# Patient Record
Sex: Male | Born: 1980
Health system: Southern US, Community
[De-identification: ages and names within clinical notes are randomized; demographics above are authoritative.]

## PROBLEM LIST (undated history)

## (undated) DIAGNOSIS — E7800 Pure hypercholesterolemia, unspecified: Secondary | ICD-10-CM

## (undated) HISTORY — PX: NO PAST SURGERIES: SHX2092

## (undated) HISTORY — DX: Pure hypercholesterolemia, unspecified: E78.00

---

## 2017-08-22 DIAGNOSIS — K649 Unspecified hemorrhoids: Secondary | ICD-10-CM | POA: Diagnosis not present

## 2017-09-11 DIAGNOSIS — K649 Unspecified hemorrhoids: Secondary | ICD-10-CM | POA: Diagnosis not present

## 2017-09-11 DIAGNOSIS — Z0000 Encounter for general adult medical examination without abnormal findings: Secondary | ICD-10-CM | POA: Diagnosis not present

## 2017-09-25 DIAGNOSIS — M25511 Pain in right shoulder: Secondary | ICD-10-CM | POA: Diagnosis not present

## 2017-09-25 DIAGNOSIS — M79641 Pain in right hand: Secondary | ICD-10-CM | POA: Diagnosis not present

## 2017-09-25 DIAGNOSIS — Z0000 Encounter for general adult medical examination without abnormal findings: Secondary | ICD-10-CM | POA: Diagnosis not present

## 2017-10-09 ENCOUNTER — Encounter: Payer: Self-pay | Admitting: Internal Medicine

## 2018-01-14 ENCOUNTER — Other Ambulatory Visit: Payer: Self-pay | Admitting: *Deleted

## 2018-01-14 ENCOUNTER — Encounter: Payer: Self-pay | Admitting: *Deleted

## 2018-01-14 ENCOUNTER — Encounter: Payer: Self-pay | Admitting: Nurse Practitioner

## 2018-01-14 ENCOUNTER — Ambulatory Visit: Payer: 59 | Admitting: Nurse Practitioner

## 2018-01-14 DIAGNOSIS — K625 Hemorrhage of anus and rectum: Secondary | ICD-10-CM | POA: Diagnosis not present

## 2018-01-14 DIAGNOSIS — K649 Unspecified hemorrhoids: Secondary | ICD-10-CM | POA: Diagnosis not present

## 2018-01-14 MED ORDER — PEG 3350-KCL-NA BICARB-NACL 420 G PO SOLR: 4000.0000 mL | Freq: Once | ORAL | 0 refills | Status: AC

## 2018-01-14 NOTE — Progress Notes (Signed)
Primary Care Physician:  Celene Squibb, MD Primary Gastroenterologist:  Dr. Gala Romney  Chief Complaint  Patient presents with  . Hemorrhoids    some blood when wipes    HPI:   Jeremy Combs is a 37 y.o. male who presents on referral from primary care for evaluation of hemorrhoids.  Reviewed information provided with the referral including office visit dated 09/25/2017 which was a routine physical.  At that time it was noted he has hemorrhoids and was using over-the-counter baby wipes and Tucks pads.  Has daily bowel movements and denies straining.  He was experiencing a flare at that time.  He was referred to GI for further evaluation..  No history of colonoscopy in our system.  Today he states he's doing ok overall. Started having hemorrhoids about 15 years ago, drives a truck for a living. Denies constipation or straining. Sits on the toilet for variable about of time depending on what he's doing on his phone. Is having some more fecal urgency. If he does more than once in a day it is painful with wiping. Uses OTC wet wipes. Still has toilet tissue hematochezia with every bowel movement. Has never had a colonoscopy. His brother had 3 polyps at age 7. Denies melena, fever, chills, unintentional weight loss. Occasional/rare dizziness, typically if he sitting down and eating; brief and self-resolves. Denies chest pain, dyspnea, lightheadedness, syncope, near syncope. Denies any other upper or lower GI symptoms.  Past Medical History:  Diagnosis Date  . Hypercholesterolemia     Past Surgical History:  Procedure Laterality Date  . NO PAST SURGERIES      Current Outpatient Medications  Medication Sig Dispense Refill  . ibuprofen (ADVIL,MOTRIN) 200 MG tablet Take 600 mg by mouth every 6 (six) hours as needed.     No current facility-administered medications for this visit.     Allergies as of 01/14/2018  . (No Known Allergies)    Family History  Problem Relation Age of Onset  .  Colon polyps Father 9  . Colon polyps Brother 41       Is going to need surgery to have them removed  . Colon cancer Neg Hx     Social History   Socioeconomic History  . Marital status: Single    Spouse name: Not on file  . Number of children: Not on file  . Years of education: Not on file  . Highest education level: Not on file  Occupational History  . Not on file  Social Needs  . Financial resource strain: Not on file  . Food insecurity:    Worry: Not on file    Inability: Not on file  . Transportation needs:    Medical: Not on file    Non-medical: Not on file  Tobacco Use  . Smoking status: Never Smoker  . Smokeless tobacco: Never Used  Substance and Sexual Activity  . Alcohol use: Yes    Comment: beer-less than 6 pack/week  . Drug use: Never  . Sexual activity: Not on file  Lifestyle  . Physical activity:    Days per week: Not on file    Minutes per session: Not on file  . Stress: Not on file  Relationships  . Social connections:    Talks on phone: Not on file    Gets together: Not on file    Attends religious service: Not on file    Active member of club or organization: Not on file    Attends  meetings of clubs or organizations: Not on file    Relationship status: Not on file  . Intimate partner violence:    Fear of current or ex partner: Not on file    Emotionally abused: Not on file    Physically abused: Not on file    Forced sexual activity: Not on file  Other Topics Concern  . Not on file  Social History Narrative  . Not on file    Review of Systems: General: Negative for anorexia, weight loss, fever, chills, fatigue, weakness. ENT: Negative for hoarseness, difficulty swallowing. CV: Negative for chest pain, angina, palpitations, peripheral edema.  Respiratory: Negative for dyspnea at rest, cough, sputum, wheezing.  GI: See history of present illness. MS: Negative for joint pain, low back pain.  Derm: Negative for rash or itching.  Endo:  Negative for unusual weight change.  Heme: Negative for bruising or bleeding. Allergy: Negative for rash or hives.    Physical Exam: BP (!) 143/76   Pulse 79   Temp (!) 97.3 F (36.3 C) (Oral)   Ht 6' (1.829 m)   Wt 227 lb 9.6 oz (103.2 kg)   BMI 30.87 kg/m  General:   Alert and oriented. Pleasant and cooperative. Well-nourished and well-developed.  Eyes:  Without icterus, sclera clear and conjunctiva pink.  Ears:  Normal auditory acuity. Cardiovascular:  S1, S2 present without murmurs appreciated. Extremities without clubbing or edema. Respiratory:  Clear to auscultation bilaterally. No wheezes, rales, or rhonchi. No distress.  Gastrointestinal:  +BS, soft, non-tender and non-distended. No HSM noted. No guarding or rebound. No masses appreciated.  Rectal:  Deferred  Musculoskalatal:  Symmetrical without gross deformities. Neurologic:  Alert and oriented x4;  grossly normal neurologically. Psych:  Alert and cooperative. Normal mood and affect. Heme/Lymph/Immune: No excessive bruising noted.    01/14/2018 4:17 PM   Disclaimer: This note was dictated with voice recognition software. Similar sounding words can inadvertently be transcribed and may not be corrected upon review.

## 2018-01-14 NOTE — Patient Instructions (Signed)
1. We will schedule your colonoscopy for you. 2. We will request your lab work from your primary care doctor. 3. Return for follow-up in 3 months.  We can discuss the possibility of hemorrhoid banding at that time. 4. Call us if you have any questions or concerns.  At [facility] we value your feedback. You may receive a survey about your visit today. Please share your experience as we strive to create trusting relationships with our patients to provide genuine, compassionate, quality care.  We appreciate your understanding and patience as we review any laboratory studies, imaging, and other diagnostic tests that are ordered as we care for you. Our office policy is 5 business days for review of these results, and any emergent or urgent results are addressed in a timely manner for your best interest. If you do not hear from our office in 1 week, please contact us.   We also encourage the use of MyChart, which contains your medical information for your review as well. If you are not enrolled in this feature, an access code is on this after visit summary for your convenience. Thank you for allowing [place] to be involved in your care.   It was great to see you today!  I hope you have a Merry Christmas!!

## 2018-01-14 NOTE — H&P (View-Only) (Signed)
Primary Care Physician:  Celene Squibb, MD Primary Gastroenterologist:  Dr. Gala Romney  Chief Complaint  Patient presents with  . Hemorrhoids    some blood when wipes    HPI:   Jeremy Combs is a 37 y.o. male who presents on referral from primary care for evaluation of hemorrhoids.  Reviewed information provided with the referral including office visit dated 09/25/2017 which was a routine physical.  At that time it was noted he has hemorrhoids and was using over-the-counter baby wipes and Tucks pads.  Has daily bowel movements and denies straining.  He was experiencing a flare at that time.  He was referred to GI for further evaluation..  No history of colonoscopy in our system.  Today he states he's doing ok overall. Started having hemorrhoids about 15 years ago, drives a truck for a living. Denies constipation or straining. Sits on the toilet for variable about of time depending on what he's doing on his phone. Is having some more fecal urgency. If he does more than once in a day it is painful with wiping. Uses OTC wet wipes. Still has toilet tissue hematochezia with every bowel movement. Has never had a colonoscopy. His brother had 3 polyps at age 37. Denies melena, fever, chills, unintentional weight loss. Occasional/rare dizziness, typically if he sitting down and eating; brief and self-resolves. Denies chest pain, dyspnea, lightheadedness, syncope, near syncope. Denies any other upper or lower GI symptoms.  Past Medical History:  Diagnosis Date  . Hypercholesterolemia     Past Surgical History:  Procedure Laterality Date  . NO PAST SURGERIES      Current Outpatient Medications  Medication Sig Dispense Refill  . ibuprofen (ADVIL,MOTRIN) 200 MG tablet Take 600 mg by mouth every 6 (six) hours as needed.     No current facility-administered medications for this visit.     Allergies as of 01/14/2018  . (No Known Allergies)    Family History  Problem Relation Age of Onset  .  Colon polyps Father 56  . Colon polyps Brother 20       Is going to need surgery to have them removed  . Colon cancer Neg Hx     Social History   Socioeconomic History  . Marital status: Single    Spouse name: Not on file  . Number of children: Not on file  . Years of education: Not on file  . Highest education level: Not on file  Occupational History  . Not on file  Social Needs  . Financial resource strain: Not on file  . Food insecurity:    Worry: Not on file    Inability: Not on file  . Transportation needs:    Medical: Not on file    Non-medical: Not on file  Tobacco Use  . Smoking status: Never Smoker  . Smokeless tobacco: Never Used  Substance and Sexual Activity  . Alcohol use: Yes    Comment: beer-less than 6 pack/week  . Drug use: Never  . Sexual activity: Not on file  Lifestyle  . Physical activity:    Days per week: Not on file    Minutes per session: Not on file  . Stress: Not on file  Relationships  . Social connections:    Talks on phone: Not on file    Gets together: Not on file    Attends religious service: Not on file    Active member of club or organization: Not on file    Attends  meetings of clubs or organizations: Not on file    Relationship status: Not on file  . Intimate partner violence:    Fear of current or ex partner: Not on file    Emotionally abused: Not on file    Physically abused: Not on file    Forced sexual activity: Not on file  Other Topics Concern  . Not on file  Social History Narrative  . Not on file    Review of Systems: General: Negative for anorexia, weight loss, fever, chills, fatigue, weakness. ENT: Negative for hoarseness, difficulty swallowing. CV: Negative for chest pain, angina, palpitations, peripheral edema.  Respiratory: Negative for dyspnea at rest, cough, sputum, wheezing.  GI: See history of present illness. MS: Negative for joint pain, low back pain.  Derm: Negative for rash or itching.  Endo:  Negative for unusual weight change.  Heme: Negative for bruising or bleeding. Allergy: Negative for rash or hives.    Physical Exam: BP (!) 143/76   Pulse 79   Temp (!) 97.3 F (36.3 C) (Oral)   Ht 6' (1.829 m)   Wt 227 lb 9.6 oz (103.2 kg)   BMI 30.87 kg/m  General:   Alert and oriented. Pleasant and cooperative. Well-nourished and well-developed.  Eyes:  Without icterus, sclera clear and conjunctiva pink.  Ears:  Normal auditory acuity. Cardiovascular:  S1, S2 present without murmurs appreciated. Extremities without clubbing or edema. Respiratory:  Clear to auscultation bilaterally. No wheezes, rales, or rhonchi. No distress.  Gastrointestinal:  +BS, soft, non-tender and non-distended. No HSM noted. No guarding or rebound. No masses appreciated.  Rectal:  Deferred  Musculoskalatal:  Symmetrical without gross deformities. Neurologic:  Alert and oriented x4;  grossly normal neurologically. Psych:  Alert and cooperative. Normal mood and affect. Heme/Lymph/Immune: No excessive bruising noted.    01/14/2018 4:17 PM   Disclaimer: This note was dictated with voice recognition software. Similar sounding words can inadvertently be transcribed and may not be corrected upon review.

## 2018-01-15 ENCOUNTER — Encounter: Payer: Self-pay | Admitting: Internal Medicine

## 2018-01-15 ENCOUNTER — Telehealth: Payer: Self-pay | Admitting: *Deleted

## 2018-01-15 NOTE — Telephone Encounter (Signed)
Pre-op scheduled for 12/17 at 2:15pm. Called patient and he voiced understanding. Letter mailed.

## 2018-01-15 NOTE — Telephone Encounter (Signed)
PA approved. Auth# [id number] date 01/29/18

## 2018-01-15 NOTE — Telephone Encounter (Signed)
PA submitted via [facility] website. PA has been pended. Tracking # [id number]

## 2018-01-16 NOTE — Assessment & Plan Note (Signed)
Rectal bleeding without constipation or straining in the setting of known hemorrhoids.  His brother did have 3 polyps age 36.  He states that they are planning surgery for his brothers polyps, query large flat polyp although details are unknown.  CBC has been done and we have requested this.  We will plan for colonoscopy as per above.  Likely hemorrhoid in nature, although cannot rule out more insidious pathology.  Return for follow-up in 3 months.

## 2018-01-16 NOTE — Assessment & Plan Note (Signed)
The patient admits chronic hemorrhoids for about 15 years.  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  No constipation or straining.  Possible prolonged sitting during toilet time as he tends to use his phone while using the bathroom.  Has rectal pain and scant toilet tissue hematochezia.  Uses wet wipes to help, Tucks pads when at home.  His brother had polyps at age 36.  At this point his primary care has recently done a CBC and we will request these records.  We will plan for colonoscopy at this time given rectal bleeding.  We can assess his candidacy for possible hemorrhoid banding to be done in office if appropriate.  Follow-up in 3 months.  Proceed with TCS on propofol/MAC with Dr. Gala Romney in near future: the risks, benefits, and alternatives have been discussed with the patient in detail. The patient states understanding and desires to proceed.  The patient is not on any anticoagulants, anxiolytics, chronic pain medications, or antidepressants.  Drinks weekly, typically less than a sixpack a week.  He is of young age.  Given these we will plan for the procedure on propofol/MAC to promote adequate sedation.

## 2018-01-19 NOTE — Progress Notes (Signed)
CC'D TO PCP °

## 2018-01-22 NOTE — Patient Instructions (Signed)
Jeremy Combs  01/22/2018     @PREFPERIOPPHARMACY @   Your procedure is scheduled on  01/29/2018 .  Report to [facility] at  1215   P.M.  Call this number if you have problems the morning of surgery:  [phone]   Remember:  Follow the diet and prep instructions given to you by Dr Roseanne Kaufman office.                       Take these medicines the morning of surgery with A SIP OF WATER  None    Do not wear jewelry, make-up or nail polish.  Do not wear lotions, powders, or perfumes, or deodorant.  Do not shave 48 hours prior to surgery.  Men may shave face and neck.  Do not bring valuables to the hospital.  [facility] is not responsible for any belongings or valuables.  Contacts, dentures or bridgework may not be worn into surgery.  Leave your suitcase in the car.  After surgery it may be brought to your room.  For patients admitted to the hospital, discharge time will be determined by your treatment team.  Patients discharged the day of surgery will not be allowed to drive home.   Name and phone number of your driver:    Family    Special instructions:  None  Please read over the following fact sheets that you were given. Anesthesia Post-op Instructions and Care and Recovery After Surgery       Colonoscopy, Adult A colonoscopy is an exam to look at the large intestine. It is done to check for problems, such as:  Lumps (tumors).  Growths (polyps).  Swelling (inflammation).  Bleeding.  What happens before the procedure? Eating and drinking Follow instructions from your doctor about eating and drinking. These instructions may include:  A few days before the procedure - follow a low-fiber diet. ? Avoid nuts. ? Avoid seeds. ? Avoid dried fruit. ? Avoid raw fruits. ? Avoid vegetables.  1-3 days before the procedure - follow a clear liquid diet. Avoid liquids that have red or purple dye. Drink only clear liquids, such as: ? Clear broth or  bouillon. ? Black coffee or tea. ? Clear juice. ? Clear soft drinks or sports drinks. ? Gelatin dessert. ? Popsicles.  On the day of the procedure - do not eat or drink anything during the 2 hours before the procedure.  Bowel prep If you were prescribed an oral bowel prep:  Take it as told by your doctor. Starting the day before your procedure, you will need to drink a lot of liquid. The liquid will cause you to poop (have bowel movements) until your poop is almost clear or light green.  If your skin or butt gets irritated from diarrhea, you may: ? Wipe the area with wipes that have medicine in them, such as adult wet wipes with aloe and vitamin E. ? Put something on your skin that soothes the area, such as petroleum jelly.  If you throw up (vomit) while drinking the bowel prep, take a break for up to 60 minutes. Then begin the bowel prep again. If you keep throwing up and you cannot take the bowel prep without throwing up, call your doctor.  General instructions  Ask your doctor about changing or stopping your normal medicines. This is important if you take diabetes medicines or blood thinners.  Plan to have someone take you  home from the hospital or clinic. What happens during the procedure?  An IV tube may be put into one of your veins.  You will be given medicine to help you relax (sedative).  To reduce your risk of infection: ? Your doctors will wash their hands. ? Your anal area will be washed with soap.  You will be asked to lie on your side with your knees bent.  Your doctor will get a long, thin, flexible tube ready. The tube will have a camera and a light on the end.  The tube will be put into your anus.  The tube will be gently put into your large intestine.  Air will be delivered into your large intestine to keep it open. You may feel some pressure or cramping.  The camera will be used to take photos.  A small tissue sample may be removed from your body to  be looked at under a microscope (biopsy). If any possible problems are found, the tissue will be sent to a lab for testing.  If small growths are found, your doctor may remove them and have them checked for cancer.  The tube that was put into your anus will be slowly removed. The procedure may vary among doctors and hospitals. What happens after the procedure?  Your doctor will check on you often until the medicines you were given have worn off.  Do not drive for 24 hours after the procedure.  You may have a small amount of blood in your poop.  You may pass gas.  You may have mild cramps or bloating in your belly (abdomen).  It is up to you to get the results of your procedure. Ask your doctor, or the department performing the procedure, when your results will be ready. This information is not intended to replace advice given to you by your health care provider. Make sure you discuss any questions you have with your health care provider. Document Released: 03/02/2010 Document Revised: 11/29/2015 Document Reviewed: 04/11/2015 Elsevier Interactive Patient Education  2017 [employer]  Colonoscopy, Adult, Care After This sheet gives you information about how to care for yourself after your procedure. Your health care provider may also give you more specific instructions. If you have problems or questions, contact your health care provider. What can I expect after the procedure? After the procedure, it is common to have:  A small amount of blood in your stool for 24 hours after the procedure.  Some gas.  Mild abdominal cramping or bloating.  Follow these instructions at home: General instructions   For the first 24 hours after the procedure: ? Do not drive or use machinery. ? Do not sign important documents. ? Do not drink alcohol. ? Do your regular daily activities at a slower pace than normal. ? Eat soft, easy-to-digest foods. ? Rest often.  Take over-the-counter or  prescription medicines only as told by your health care provider.  It is up to you to get the results of your procedure. Ask your health care provider, or the department performing the procedure, when your results will be ready. Relieving cramping and bloating  Try walking around when you have cramps or feel bloated.  Apply heat to your abdomen as told by your health care provider. Use a heat source that your health care provider recommends, such as a moist heat pack or a heating pad. ? Place a towel between your skin and the heat source. ? Leave the heat on for 20-30 minutes. ?  Remove the heat if your skin turns bright red. This is especially important if you are unable to feel pain, heat, or cold. You may have a greater risk of getting burned. Eating and drinking  Drink enough fluid to keep your urine clear or pale yellow.  Resume your normal diet as instructed by your health care provider. Avoid heavy or fried foods that are hard to digest.  Avoid drinking alcohol for as long as instructed by your health care provider. Contact a health care provider if:  You have blood in your stool 2-3 days after the procedure. Get help right away if:  You have more than a small spotting of blood in your stool.  You pass large blood clots in your stool.  Your abdomen is swollen.  You have nausea or vomiting.  You have a fever.  You have increasing abdominal pain that is not relieved with medicine. This information is not intended to replace advice given to you by your health care provider. Make sure you discuss any questions you have with your health care provider. Document Released: 09/12/2003 Document Revised: 10/23/2015 Document Reviewed: 04/11/2015 Elsevier Interactive Patient Education  2018 [place] Anesthesia is a term that refers to techniques, procedures, and medicines that help a person stay safe and comfortable during a medical procedure.  Monitored anesthesia care, or sedation, is one type of anesthesia. Your anesthesia specialist may recommend sedation if you will be having a procedure that does not require you to be unconscious, such as:  Cataract surgery.  A dental procedure.  A biopsy.  A colonoscopy.  During the procedure, you may receive a medicine to help you relax (sedative). There are three levels of sedation:  Mild sedation. At this level, you may feel awake and relaxed. You will be able to follow directions.  Moderate sedation. At this level, you will be sleepy. You may not remember the procedure.  Deep sedation. At this level, you will be asleep. You will not remember the procedure.  The more medicine you are given, the deeper your level of sedation will be. Depending on how you respond to the procedure, the anesthesia specialist may change your level of sedation or the type of anesthesia to fit your needs. An anesthesia specialist will monitor you closely during the procedure. Let your health care provider know about:  Any allergies you have.  All medicines you are taking, including vitamins, herbs, eye drops, creams, and over-the-counter medicines.  Any use of steroids (by mouth or as a cream).  Any problems you or family members have had with sedatives and anesthetic medicines.  Any blood disorders you have.  Any surgeries you have had.  Any medical conditions you have, such as sleep apnea.  Whether you are pregnant or may be pregnant.  Any use of cigarettes, alcohol, or street drugs. What are the risks? Generally, this is a safe procedure. However, problems may occur, including:  Getting too much medicine (oversedation).  Nausea.  Allergic reaction to medicines.  Trouble breathing. If this happens, a breathing tube may be used to help with breathing. It will be removed when you are awake and breathing on your own.  Heart trouble.  Lung trouble.  Before the procedure Staying  hydrated Follow instructions from your health care provider about hydration, which may include:  Up to 2 hours before the procedure - you may continue to drink clear liquids, such as water, clear fruit juice, black coffee, and plain tea.  Eating and drinking restrictions Follow instructions from your health care provider about eating and drinking, which may include:  8 hours before the procedure - stop eating heavy meals or foods such as meat, fried foods, or fatty foods.  6 hours before the procedure - stop eating light meals or foods, such as toast or cereal.  6 hours before the procedure - stop drinking milk or drinks that contain milk.  2 hours before the procedure - stop drinking clear liquids.  Medicines Ask your health care provider about:  Changing or stopping your regular medicines. This is especially important if you are taking diabetes medicines or blood thinners.  Taking medicines such as aspirin and ibuprofen. These medicines can thin your blood. Do not take these medicines before your procedure if your health care provider instructs you not to.  Tests and exams  You will have a physical exam.  You may have blood tests done to show: ? How well your kidneys and liver are working. ? How well your blood can clot.  General instructions  Plan to have someone take you home from the hospital or clinic.  If you will be going home right after the procedure, plan to have someone with you for 24 hours.  What happens during the procedure?  Your blood pressure, heart rate, breathing, level of pain and overall condition will be monitored.  An IV tube will be inserted into one of your veins.  Your anesthesia specialist will give you medicines as needed to keep you comfortable during the procedure. This may mean changing the level of sedation.  The procedure will be performed. After the procedure  Your blood pressure, heart rate, breathing rate, and blood oxygen level  will be monitored until the medicines you were given have worn off.  Do not drive for 24 hours if you received a sedative.  You may: ? Feel sleepy, clumsy, or nauseous. ? Feel forgetful about what happened after the procedure. ? Have a sore throat if you had a breathing tube during the procedure. ? Vomit. This information is not intended to replace advice given to you by your health care provider. Make sure you discuss any questions you have with your health care provider. Document Released: 10/24/2004 Document Revised: 07/07/2015 Document Reviewed: 05/21/2015 Elsevier Interactive Patient Education  2018 [place], Care After These instructions provide you with information about caring for yourself after your procedure. Your health care provider may also give you more specific instructions. Your treatment has been planned according to current medical practices, but problems sometimes occur. Call your health care provider if you have any problems or questions after your procedure. What can I expect after the procedure? After your procedure, it is common to:  Feel sleepy for several hours.  Feel clumsy and have poor balance for several hours.  Feel forgetful about what happened after the procedure.  Have poor judgment for several hours.  Feel nauseous or vomit.  Have a sore throat if you had a breathing tube during the procedure.  Follow these instructions at home: For at least 24 hours after the procedure:   Do not: ? Participate in activities in which you could fall or become injured. ? Drive. ? Use heavy machinery. ? Drink alcohol. ? Take sleeping pills or medicines that cause drowsiness. ? Make important decisions or sign legal documents. ? Take care of children on your own.  Rest. Eating and drinking  Follow the diet that is recommended by your  health care provider.  If you vomit, drink water, juice, or soup when you can drink without  vomiting.  Make sure you have little or no nausea before eating solid foods. General instructions  Have a responsible adult stay with you until you are awake and alert.  Take over-the-counter and prescription medicines only as told by your health care provider.  If you smoke, do not smoke without supervision.  Keep all follow-up visits as told by your health care provider. This is important. Contact a health care provider if:  You keep feeling nauseous or you keep vomiting.  You feel light-headed.  You develop a rash.  You have a fever. Get help right away if:  You have trouble breathing. This information is not intended to replace advice given to you by your health care provider. Make sure you discuss any questions you have with your health care provider. Document Released: 05/21/2015 Document Revised: 09/20/2015 Document Reviewed: 05/21/2015 Elsevier Interactive Patient Education  [employer].

## 2018-01-23 ENCOUNTER — Other Ambulatory Visit (HOSPITAL_COMMUNITY): Payer: 59

## 2018-01-27 ENCOUNTER — Encounter (HOSPITAL_COMMUNITY): Payer: Self-pay

## 2018-01-27 ENCOUNTER — Other Ambulatory Visit: Payer: Self-pay

## 2018-01-27 ENCOUNTER — Encounter (HOSPITAL_COMMUNITY)
Admission: RE | Admit: 2018-01-27 | Discharge: 2018-01-27 | Disposition: A | Discharge status: Home / Self Care | Payer: 59 | Source: Ambulatory Visit | Attending: Internal Medicine | Admitting: Internal Medicine

## 2018-01-27 DIAGNOSIS — K640 First degree hemorrhoids: Secondary | ICD-10-CM | POA: Diagnosis not present

## 2018-01-27 DIAGNOSIS — E7800 Pure hypercholesterolemia, unspecified: Secondary | ICD-10-CM | POA: Diagnosis not present

## 2018-01-27 DIAGNOSIS — Z01812 Encounter for preprocedural laboratory examination: Secondary | ICD-10-CM

## 2018-01-27 DIAGNOSIS — Z791 Long term (current) use of non-steroidal anti-inflammatories (NSAID): Secondary | ICD-10-CM | POA: Diagnosis not present

## 2018-01-27 DIAGNOSIS — K921 Melena: Secondary | ICD-10-CM | POA: Diagnosis present

## 2018-01-27 DIAGNOSIS — K635 Polyp of colon: Secondary | ICD-10-CM | POA: Diagnosis not present

## 2018-01-27 LAB — CBC
HCT: 46.2 % (ref 39.0–52.0)
Hemoglobin: 15.5 g/dL (ref 13.0–17.0)
MCH: 29.4 pg (ref 26.0–34.0)
MCHC: 33.5 g/dL (ref 30.0–36.0)
MCV: 87.5 fL (ref 80.0–100.0)
Platelets: 283 10*3/uL (ref 150–400)
RBC: 5.28 MIL/uL (ref 4.22–5.81)
RDW: 12.5 % (ref 11.5–15.5)
WBC: 9 10*3/uL (ref 4.0–10.5)
nRBC: 0 % (ref 0.0–0.2)

## 2018-01-29 ENCOUNTER — Ambulatory Visit (HOSPITAL_COMMUNITY): Payer: 59 | Admitting: Registered Nurse

## 2018-01-29 ENCOUNTER — Encounter (HOSPITAL_COMMUNITY)
Admission: RE | Disposition: A | Discharge status: Home / Self Care | Payer: Self-pay | Source: Ambulatory Visit | Attending: Internal Medicine

## 2018-01-29 ENCOUNTER — Encounter (HOSPITAL_COMMUNITY): Payer: Self-pay

## 2018-01-29 ENCOUNTER — Ambulatory Visit (HOSPITAL_COMMUNITY)
Admission: RE | Admit: 2018-01-29 | Discharge: 2018-01-29 | Disposition: A | Discharge status: Home / Self Care | Payer: 59 | Source: Ambulatory Visit | Attending: Internal Medicine | Admitting: Internal Medicine

## 2018-01-29 DIAGNOSIS — K640 First degree hemorrhoids: Secondary | ICD-10-CM | POA: Insufficient documentation

## 2018-01-29 DIAGNOSIS — K649 Unspecified hemorrhoids: Secondary | ICD-10-CM

## 2018-01-29 DIAGNOSIS — D125 Benign neoplasm of sigmoid colon: Secondary | ICD-10-CM

## 2018-01-29 DIAGNOSIS — K635 Polyp of colon: Secondary | ICD-10-CM | POA: Insufficient documentation

## 2018-01-29 DIAGNOSIS — E7800 Pure hypercholesterolemia, unspecified: Secondary | ICD-10-CM | POA: Diagnosis not present

## 2018-01-29 DIAGNOSIS — K625 Hemorrhage of anus and rectum: Secondary | ICD-10-CM

## 2018-01-29 DIAGNOSIS — K921 Melena: Secondary | ICD-10-CM

## 2018-01-29 DIAGNOSIS — Z791 Long term (current) use of non-steroidal anti-inflammatories (NSAID): Secondary | ICD-10-CM | POA: Insufficient documentation

## 2018-01-29 HISTORY — PX: POLYPECTOMY: SHX5525

## 2018-01-29 HISTORY — PX: COLONOSCOPY WITH PROPOFOL: SHX5780

## 2018-01-29 SURGERY — COLONOSCOPY WITH PROPOFOL
Anesthesia: Monitor Anesthesia Care

## 2018-01-29 MED ORDER — CHLORHEXIDINE GLUCONATE CLOTH 2 % EX PADS: 6.0000 | MEDICATED_PAD | Freq: Once | CUTANEOUS | Status: DC

## 2018-01-29 MED ORDER — PROPOFOL 10 MG/ML IV BOLUS
INTRAVENOUS | Status: AC
  Filled 2018-01-29: qty 20

## 2018-01-29 MED ORDER — PROPOFOL 10 MG/ML IV BOLUS
INTRAVENOUS | Status: DC | PRN
  Administered 2018-01-29: 30 mg via INTRAVENOUS

## 2018-01-29 MED ORDER — PROPOFOL 500 MG/50ML IV EMUL
INTRAVENOUS | Status: DC | PRN
  Administered 2018-01-29: 200 ug/kg/min via INTRAVENOUS

## 2018-01-29 MED ORDER — LACTATED RINGERS IV SOLN
INTRAVENOUS | Status: DC
  Administered 2018-01-29: 13:00:00 via INTRAVENOUS

## 2018-01-29 MED ORDER — STERILE WATER FOR IRRIGATION IR SOLN
Status: DC | PRN
  Administered 2018-01-29: 1.5 mL

## 2018-01-29 NOTE — Transfer of Care (Signed)
Immediate Anesthesia Transfer of Care Note  Patient: Jeremy Combs  Procedure(s) Performed: COLONOSCOPY WITH PROPOFOL (N/A ) POLYPECTOMY  Patient Location: PACU  Anesthesia Type:MAC  Level of Consciousness: sedated  Airway & Oxygen Therapy: Patient Spontanous Breathing  Post-op Assessment: Report given to RN and Post -op Vital signs reviewed and stable  Post vital signs: Reviewed and stable  Last Vitals:  Vitals Value Taken Time  BP    Temp    Pulse    Resp    SpO2      Last Pain:  Vitals:   01/29/18 1406  TempSrc:   PainSc: 0-No pain         Complications: No apparent anesthesia complications

## 2018-01-29 NOTE — Op Note (Signed)
[facility] Patient Name: Jeremy Combs Procedure Date: 01/29/2018 2:03 PM MRN: [id number] Date of Birth: 1980/03/13 Attending MD: Norvel Richards , MD CSN: [id number] Age: 37 Admit Type: Outpatient Procedure:                Colonoscopy Indications:              Hematochezia Providers:                Norvel Richards, MD, Lurline Del, RN, Gerome Sam, RN, Nelma Rothman, Technician Referring MD:              Medicines:                Propofol per Anesthesia Complications:            No immediate complications. Estimated Blood Loss:     Estimated blood loss was minimal. Procedure:                Pre-Anesthesia Assessment:                           - Prior to the procedure, a History and Physical                            was performed, and patient medications and                            allergies were reviewed. The patient's tolerance of                            previous anesthesia was also reviewed. The risks                            and benefits of the procedure and the sedation                            options and risks were discussed with the patient.                            All questions were answered, and informed consent                            was obtained. Prior Anticoagulants: The patient has                            taken no previous anticoagulant or antiplatelet                            agents. ASA Grade Assessment: II - A patient with                            mild systemic disease. After reviewing the risks  and benefits, the patient was deemed in                            satisfactory condition to undergo the procedure.                           After obtaining informed consent, the colonoscope                            was passed under direct vision. Throughout the                            procedure, the patient's blood pressure, pulse, and                            oxygen  saturations were monitored continuously. The                            [id number]-[id number] ([id number]) scope was introduced through                            the and advanced to the the cecum, identified by                            appendiceal orifice and ileocecal valve. The                            colonoscopy was performed without difficulty. The                            patient tolerated the procedure well. The quality                            of the bowel preparation was adequate. Scope In: 2:10:56 PM Scope Out: 2:21:59 PM Scope Withdrawal Time: 0 hours 8 minutes 47 seconds  Total Procedure Duration: 0 hours 11 minutes 3 seconds  Findings:      The perianal and digital rectal examinations were normal.      A 5 mm polyp was found in the sigmoid colon. The polyp was sessile. The       polyp was removed with a cold snare. Resection and retrieval were       complete. Estimated blood loss was minimal.      The exam was otherwise without abnormality on direct and retroflexion       views.      Non-bleeding internal hemorrhoids were found during endoscopy. The       hemorrhoids were moderate, medium-sized and Grade I (internal       hemorrhoids that do not prolapse).      The exam was otherwise without abnormality. Impression:               - One 5 mm polyp in the sigmoid colon, removed with                            a cold snare. Resected and retrieved.                           -  The examination was otherwise normal on direct                            and retroflexion views.                           - Non-bleeding internal hemorrhoids.                           - The examination was otherwise normal. Moderate Sedation:      Moderate (conscious) sedation was personally administered by an       anesthesia professional. The following parameters were monitored: oxygen       saturation, heart rate, blood pressure, respiratory rate, EKG, adequacy       of pulmonary ventilation, and  response to care. Recommendation:           - Patient has a contact number available for                            emergencies. The signs and symptoms of potential                            delayed complications were discussed with the                            patient. Return to normal activities tomorrow.                            Written discharge instructions were provided to the                            patient.                           - Advance diet as tolerated.                           - Continue present medications.                           - Repeat colonoscopy at age 9 for screening                            purposes. Pamphlet on hemorrhoid banding provided                           - Return to GI office in 6 weeks. Procedure Code(s):        --- Professional ---                           [phone], Colonoscopy, flexible; with removal of                            tumor(s), polyp(s), or other lesion(s) by snare  technique Diagnosis Code(s):        --- Professional ---                           D12.5, Benign neoplasm of sigmoid colon                           K64.0, First degree hemorrhoids                           K92.1, Melena (includes Hematochezia) CPT copyright 2018 American Medical Association. All rights reserved. The codes documented in this report are preliminary and upon coder review may  be revised to meet current compliance requirements. [name]. [name], MD Norvel Richards, MD 01/29/2018 2:32:56 PM This report has been signed electronically. Number of Addenda: 0

## 2018-01-29 NOTE — Interval H&P Note (Signed)
History and Physical Interval Note:  01/29/2018 2:04 PM  Jeremy Combs  has presented today for surgery, with the diagnosis of rectal bleeding, hemorrhoids  The various methods of treatment have been discussed with the patient and family. After consideration of risks, benefits and other options for treatment, the patient has consented to  Procedure(s) with comments: COLONOSCOPY WITH PROPOFOL (N/A) - 1:45pm as a surgical intervention .  The patient's history has been reviewed, patient examined, no change in status, stable for surgery.  I have reviewed the patient's chart and labs.  Questions were answered to the patient's satisfaction.     Robert Rourk  No change.  Diagnostic colonoscopy per plan.  The risks, benefits, limitations, alternatives and imponderables have been reviewed with the patient. Questions have been answered. All parties are agreeable.

## 2018-01-29 NOTE — Anesthesia Postprocedure Evaluation (Signed)
Anesthesia Post Note  Patient: Jeremy Combs  Procedure(s) Performed: COLONOSCOPY WITH PROPOFOL (N/A ) POLYPECTOMY  Patient location during evaluation: PACU Anesthesia Type: MAC Level of consciousness: awake and alert Pain management: pain level controlled Vital Signs Assessment: post-procedure vital signs reviewed and stable Respiratory status: spontaneous breathing Cardiovascular status: blood pressure returned to baseline Postop Assessment: no apparent nausea or vomiting Anesthetic complications: no     Last Vitals:  Vitals:   01/29/18 1233 01/29/18 1426  BP: 131/73 (!) 102/51  Pulse:  78  Resp:  12  Temp:  37.1 C  SpO2:  97%    Last Pain:  Vitals:   01/29/18 1406  TempSrc:   PainSc: 0-No pain                 Collen Vincent

## 2018-01-29 NOTE — Anesthesia Procedure Notes (Signed)
Date/Time: 01/29/2018 2:07 PM Performed by: Talbot Grumbling, CRNA Oxygen Delivery Method: Simple face mask

## 2018-01-29 NOTE — Discharge Instructions (Signed)
Colonoscopy Discharge Instructions  Read the instructions outlined below and refer to this sheet in the next few weeks. These discharge instructions provide you with general information on caring for yourself after you leave the hospital. Your doctor may also give you specific instructions. While your treatment has been planned according to the most current medical practices available, unavoidable complications occasionally occur. If you have any problems or questions after discharge, call Dr. Gala Romney at [phone]. ACTIVITY  You may resume your regular activity, but move at a slower pace for the next 24 hours.   Take frequent rest periods for the next 24 hours.   Walking will help get rid of the air and reduce the bloated feeling in your belly (abdomen).   No driving for 24 hours (because of the medicine (anesthesia) used during the test).    Do not sign any important legal documents or operate any machinery for 24 hours (because of the anesthesia used during the test).  NUTRITION  Drink plenty of fluids.   You may resume your normal diet as instructed by your doctor.   Begin with a light meal and progress to your normal diet. Heavy or fried foods are harder to digest and may make you feel sick to your stomach (nauseated).   Avoid alcoholic beverages for 24 hours or as instructed.  MEDICATIONS  You may resume your normal medications unless your doctor tells you otherwise.  WHAT YOU CAN EXPECT TODAY  Some feelings of bloating in the abdomen.   Passage of more gas than usual.   Spotting of blood in your stool or on the toilet paper.  IF YOU HAD POLYPS REMOVED DURING THE COLONOSCOPY:  No aspirin products for 7 days or as instructed.   No alcohol for 7 days or as instructed.   Eat a soft diet for the next 24 hours.  FINDING OUT THE RESULTS OF YOUR TEST Not all test results are available during your visit. If your test results are not back during the visit, make an appointment  with your caregiver to find out the results. Do not assume everything is normal if you have not heard from your caregiver or the medical facility. It is important for you to follow up on all of your test results.  SEEK IMMEDIATE MEDICAL ATTENTION IF:  You have more than a spotting of blood in your stool.   Your belly is swollen (abdominal distention).   You are nauseated or vomiting.   You have a temperature over 101.   You have abdominal pain or discomfort that is severe or gets worse throughout the day.   Colon polyp and hemorrhoid information provided  Pamphlet on hemorrhoid banding provided  Further recommendations to follow pending review of pathology report  Office visit with AB in 4- 6 weeks for possible hemorrhoid banding     Colon Polyps  Polyps are tissue growths inside the body. Polyps can grow in many places, including the large intestine (colon). A polyp may be a round bump or a mushroom-shaped growth. You could have one polyp or several. Most colon polyps are noncancerous (benign). However, some colon polyps can become cancerous over time. Finding and removing the polyps early can help prevent this. What are the causes? The exact cause of colon polyps is not known. What increases the risk? You are more likely to develop this condition if you:  Have a family history of colon cancer or colon polyps.  Are older than 50 or older than 45 if  you are African American.  Have inflammatory bowel disease, such as ulcerative colitis or Crohn's disease.  Have certain hereditary conditions, such as: ? Familial adenomatous polyposis. ? Lynch syndrome. ? Turcot syndrome. ? Peutz-Jeghers syndrome.  Are overweight.  Smoke cigarettes.  Do not get enough exercise.  Drink too much alcohol.  Eat a diet that is high in fat and red meat and low in fiber.  Had childhood cancer that was treated with abdominal radiation. What are the signs or symptoms? Most polyps do not  cause symptoms. If you have symptoms, they may include:  Blood coming from your rectum when having a bowel movement.  Blood in your stool. The stool may look dark red or black.  Abdominal pain.  A change in bowel habits, such as constipation or diarrhea. How is this diagnosed? This condition is diagnosed with a colonoscopy. This is a procedure in which a lighted, flexible scope is inserted into the anus and then passed into the colon to examine the area. Polyps are sometimes found when a colonoscopy is done as part of routine cancer screening tests. How is this treated? Treatment for this condition involves removing any polyps that are found. Most polyps can be removed during a colonoscopy. Those polyps will then be tested for cancer. Additional treatment may be needed depending on the results of testing. Follow these instructions at home: Lifestyle  Maintain a healthy weight, or lose weight if recommended by your health care provider.  Exercise every day or as told by your health care provider.  Do not use any products that contain nicotine or tobacco, such as cigarettes and e-cigarettes. If you need help quitting, ask your health care provider.  If you drink alcohol, limit how much you have: ? 0-1 drink a day for women. ? 0-2 drinks a day for men.  Be aware of how much alcohol is in your drink. In the [place] one drink equals one 12 oz bottle of beer (355 mL), one 5 oz glass of wine (148 mL), or one 1 oz shot of hard liquor (44 mL). Eating and drinking   Eat foods that are high in fiber, such as fruits, vegetables, and whole grains.  Eat foods that are high in calcium and vitamin D, such as milk, cheese, yogurt, eggs, liver, fish, and broccoli.  Limit foods that are high in fat, such as fried foods and desserts.  Limit the amount of red meat and processed meat you eat, such as hot dogs, sausage, bacon, and lunch meats. General instructions  Keep all follow-up visits as told  by your health care provider. This is important. ? This includes having regularly scheduled colonoscopies. ? Talk to your health care provider about when you need a colonoscopy. Contact a health care provider if:  You have new or worsening bleeding during a bowel movement.  You have new or increased blood in your stool.  You have a change in bowel habits.  You lose weight for no known reason. Summary  Polyps are tissue growths inside the body. Polyps can grow in many places, including the colon.  Most colon polyps are noncancerous (benign), but some can become cancerous over time.  This condition is diagnosed with a colonoscopy.  Treatment for this condition involves removing any polyps that are found. Most polyps can be removed during a colonoscopy. This information is not intended to replace advice given to you by your health care provider. Make sure you discuss any questions you have with your health  care provider. Document Released: 10/25/2003 Document Revised: 05/15/2017 Document Reviewed: 05/15/2017 Elsevier Interactive Patient Education  2019 [employer]     Hemorrhoids Hemorrhoids are swollen veins in and around the rectum or anus. There are two types of hemorrhoids:  Internal hemorrhoids. These occur in the veins that are just inside the rectum. They may poke through to the outside and become irritated and painful.  External hemorrhoids. These occur in the veins that are outside the anus and can be felt as a painful swelling or hard lump near the anus. Most hemorrhoids do not cause serious problems, and they can be managed with home treatments such as diet and lifestyle changes. If home treatments do not help the symptoms, procedures can be done to shrink or remove the hemorrhoids. What are the causes? This condition is caused by increased pressure in the anal area. This pressure may result from various things, including:  Constipation.  Straining to have a bowel  movement.  Diarrhea.  Pregnancy.  Obesity.  Sitting for long periods of time.  Heavy lifting or other activity that causes you to strain.  Anal sex.  Riding a bike for a long period of time. What are the signs or symptoms? Symptoms of this condition include:  Pain.  Anal itching or irritation.  Rectal bleeding.  Leakage of stool (feces).  Anal swelling.  One or more lumps around the anus. How is this diagnosed? This condition can often be diagnosed through a visual exam. Other exams or tests may also be done, such as:  An exam that involves feeling the rectal area with a gloved hand (digital rectal exam).  An exam of the anal canal that is done using a small tube (anoscope).  A blood test, if you have lost a significant amount of blood.  A test to look inside the colon using a flexible tube with a camera on the end (sigmoidoscopy or colonoscopy). How is this treated? This condition can usually be treated at home. However, various procedures may be done if dietary changes, lifestyle changes, and other home treatments do not help your symptoms. These procedures can help make the hemorrhoids smaller or remove them completely. Some of these procedures involve surgery, and others do not. Common procedures include:  Rubber band ligation. Rubber bands are placed at the base of the hemorrhoids to cut off their blood supply.  Sclerotherapy. Medicine is injected into the hemorrhoids to shrink them.  Infrared coagulation. A type of light energy is used to get rid of the hemorrhoids.  Hemorrhoidectomy surgery. The hemorrhoids are surgically removed, and the veins that supply them are tied off.  Stapled hemorrhoidopexy surgery. The surgeon staples the base of the hemorrhoid to the rectal wall. Follow these instructions at home: Eating and drinking   Eat foods that have a lot of fiber in them, such as whole grains, beans, nuts, fruits, and vegetables.  Ask your health care  provider about taking products that have added fiber (fiber supplements).  Reduce the amount of fat in your diet. You can do this by eating low-fat dairy products, eating less red meat, and avoiding processed foods.  Drink enough fluid to keep your urine pale yellow. Managing pain and swelling   Take warm sitz baths for 20 minutes, 3-4 times a day to ease pain and discomfort. You may do this in a bathtub or using a portable sitz bath that fits over the toilet.  If directed, apply ice to the affected area. Using ice packs between  sitz baths may be helpful. ? Put ice in a plastic bag. ? Place a towel between your skin and the bag. ? Leave the ice on for 20 minutes, 2-3 times a day. General instructions  Take over-the-counter and prescription medicines only as told by your health care provider.  Use medicated creams or suppositories as told.  Get regular exercise. Ask your health care provider how much and what kind of exercise is best for you. In general, you should do moderate exercise for at least 30 minutes on most days of the week (150 minutes each week). This can include activities such as walking, biking, or yoga.  Go to the bathroom when you have the urge to have a bowel movement. Do not wait.  Avoid straining to have bowel movements.  Keep the anal area dry and clean. Use wet toilet paper or moist towelettes after a bowel movement.  Do not sit on the toilet for long periods of time. This increases blood pooling and pain.  Keep all follow-up visits as told by your health care provider. This is important. Contact a health care provider if you have:  Increasing pain and swelling that are not controlled by treatment or medicine.  Difficulty having a bowel movement, or you are unable to have a bowel movement.  Pain or inflammation outside the area of the hemorrhoids. Get help right away if you have:  Uncontrolled bleeding from your rectum. Summary  Hemorrhoids are swollen  veins in and around the rectum or anus.  Most hemorrhoids can be managed with home treatments such as diet and lifestyle changes.  Taking warm sitz baths can help ease pain and discomfort.  In severe cases, procedures or surgery can be done to shrink or remove the hemorrhoids. This information is not intended to replace advice given to you by your health care provider. Make sure you discuss any questions you have with your health care provider. Document Released: 01/26/2000 Document Revised: 06/19/2017 Document Reviewed: 06/19/2017 Elsevier Interactive Patient Education  2019 [employer].

## 2018-01-29 NOTE — Anesthesia Preprocedure Evaluation (Signed)
Anesthesia Evaluation  Patient identified by MRN, date of birth, ID band Patient awake    Reviewed: Allergy & Precautions, H&P , NPO status , Patient's Chart, lab work & pertinent test results  Airway Mallampati: II  TM Distance: >3 FB Neck ROM: full    Dental no notable dental hx.  Possible TMJ:   Pulmonary neg pulmonary ROS,    Pulmonary exam normal breath sounds clear to auscultation       Cardiovascular Exercise Tolerance: Good negative cardio ROS   Rhythm:regular Rate:Normal     Neuro/Psych negative neurological ROS  negative psych ROS   GI/Hepatic negative GI ROS, Neg liver ROS, Hemorrhoids Rectal bleeding     Endo/Other  negative endocrine ROS  Renal/GU negative Renal ROS  negative genitourinary   Musculoskeletal   Abdominal   Peds  Hematology negative hematology ROS (+)   Anesthesia Other Findings   Reproductive/Obstetrics negative OB ROS                             Anesthesia Physical Anesthesia Plan  ASA: II  Anesthesia Plan: MAC   Post-op Pain Management:    Induction:   PONV Risk Score and Plan:   Airway Management Planned:   Additional Equipment:   Intra-op Plan:   Post-operative Plan:   Informed Consent: I have reviewed the patients History and Physical, chart, labs and discussed the procedure including the risks, benefits and alternatives for the proposed anesthesia with the patient or authorized representative who has indicated his/her understanding and acceptance.     Plan Discussed with: CRNA  Anesthesia Plan Comments:         Anesthesia Quick Evaluation

## 2018-02-06 ENCOUNTER — Encounter (HOSPITAL_COMMUNITY): Payer: Self-pay | Admitting: Internal Medicine

## 2018-02-13 ENCOUNTER — Encounter: Payer: Self-pay | Admitting: Internal Medicine

## 2018-04-14 NOTE — Progress Notes (Deleted)
Colonoscopy Dec 2019 with one 5 mm polyp in sigmoid colon s/p resection, non-bleeding internal hemorrhoids. Hyperplastic polyp. Next screening at age 38.

## 2018-04-17 ENCOUNTER — Ambulatory Visit: Payer: 59 | Admitting: Gastroenterology

## 2018-04-21 ENCOUNTER — Ambulatory Visit: Payer: 59 | Admitting: Nurse Practitioner

## 2018-06-24 ENCOUNTER — Other Ambulatory Visit: Payer: Self-pay

## 2018-06-24 ENCOUNTER — Ambulatory Visit: Payer: 59 | Admitting: Gastroenterology

## 2018-06-24 ENCOUNTER — Encounter: Payer: Self-pay | Admitting: Gastroenterology

## 2018-06-24 ENCOUNTER — Telehealth: Payer: Self-pay | Admitting: Internal Medicine

## 2018-06-24 ENCOUNTER — Ambulatory Visit (INDEPENDENT_AMBULATORY_CARE_PROVIDER_SITE_OTHER): Payer: 59 | Admitting: Gastroenterology

## 2018-06-24 ENCOUNTER — Encounter: Payer: Self-pay | Admitting: Internal Medicine

## 2018-06-24 DIAGNOSIS — K642 Third degree hemorrhoids: Secondary | ICD-10-CM | POA: Diagnosis not present

## 2018-06-24 NOTE — Progress Notes (Signed)
Primary Care Physician:  Celene Squibb, MD  Primary GI: Dr. Gala Romney  Virtual Visit via Telephone Note Due to COVID-19, visit is conducted virtually and was requested by patient.   I connected with Jeremy Combs on 06/24/18 at  9:30 AM EDT by telephone and verified that I am speaking with the correct person using two identifiers.   I discussed the limitations, risks, security and privacy concerns of performing an evaluation and management service by telephone and the availability of in person appointments. I also discussed with the patient that there may be a patient responsible charge related to this service. The patient expressed understanding and agreed to proceed.  Chief Complaint  Patient presents with  . Rectal Bleeding    occas, depends, straining about the same     History of Present Illness: 38 year old male for follow-up today with history of rectal bleeding due to internal hemorrhoids. Colonoscopy on file from Dec 2019. Next screening at age 66.   Bleeding still same. Usually uses wet wipes. No pain with first BM but if has to go again, has irritation. Bleeding almost every time. Occasional itching. Prolapsing, returning in by themselves. No straining. No constipation. No abdominal pain. Interested in hemorrhoid banding.   Past Medical History:  Diagnosis Date  . Hypercholesterolemia      Past Surgical History:  Procedure Laterality Date  . COLONOSCOPY WITH PROPOFOL N/A 01/29/2018   Colonoscopy Dec 2019 with one 5 mm polyp in sigmoid colon s/p resection, non-bleeding internal hemorrhoids. Hyperplastic polyp. Next screening at age 59  . NO PAST SURGERIES    . POLYPECTOMY  01/29/2018   Procedure: POLYPECTOMY;  Surgeon: Daneil Dolin, MD;  Location: [facility];  Service: Endoscopy;;  (colon)     Current Meds  Medication Sig  . ibuprofen (ADVIL,MOTRIN) 200 MG tablet Take 600 mg by mouth every 6 (six) hours as needed for headache or moderate pain.       Family History  Problem Relation Age of Onset  . Colon polyps Father 34  . Colon polyps Brother 22       Is going to need surgery to have them removed  . Colon cancer Neg Hx     Social History   Socioeconomic History  . Marital status: Single    Spouse name: Not on file  . Number of children: Not on file  . Years of education: Not on file  . Highest education level: Not on file  Occupational History  . Not on file  Social Needs  . Financial resource strain: Not on file  . Food insecurity:    Worry: Not on file    Inability: Not on file  . Transportation needs:    Medical: Not on file    Non-medical: Not on file  Tobacco Use  . Smoking status: Never Smoker  . Smokeless tobacco: Never Used  Substance and Sexual Activity  . Alcohol use: Yes    Comment: beer-less than 6 pack/week  . Drug use: Never  . Sexual activity: Yes    Birth control/protection: None  Lifestyle  . Physical activity:    Days per week: Not on file    Minutes per session: Not on file  . Stress: Not on file  Relationships  . Social connections:    Talks on phone: Not on file    Gets together: Not on file    Attends religious service: Not on file    Active member of club  or organization: Not on file    Attends meetings of clubs or organizations: Not on file    Relationship status: Not on file  Other Topics Concern  . Not on file  Social History Narrative  . Not on file       Review of Systems: Gen: Denies fever, chills, anorexia. Denies fatigue, weakness, weight loss.  CV: Denies chest pain, palpitations, syncope, peripheral edema, and claudication. Resp: Denies dyspnea at rest, cough, wheezing, coughing up blood, and pleurisy. GI: see HPI Derm: Denies rash, itching, dry skin Psych: Denies depression, anxiety, memory loss, confusion. No homicidal or suicidal ideation.  Heme: see HPI  Observations/Objective: No distress. Pleasant and cooperative, maintains eye contact .  Assessment and  Plan: 38 year old male with symptomatic hemorrhoids that would benefit from hemorrhoid banding. I discussed in detail with him the benefits and risks. He is interested in pursuing this. I feel this would help him significantly with quality of life.   Return May 18th at 1130 for hemorrhoid banding.  Follow Up Instructions:    I discussed the assessment and treatment plan with the patient. The patient was provided an opportunity to ask questions and all were answered. The patient agreed with the plan and demonstrated an understanding of the instructions.   The patient was advised to call back or seek an in-person evaluation if the symptoms worsen or if the condition fails to improve as anticipated.  I provided 15 minutes non-face-to-face time during this video call encounter.   Annitta Needs, PhD, ANP-BC [facility]

## 2018-06-24 NOTE — Telephone Encounter (Signed)
Patient was a no show/no answer and letter sent  °

## 2018-06-24 NOTE — Telephone Encounter (Signed)
Patient seen today. I believe yesterday I thought he was a no-show, but I believe he had rescheduled to today. Please retrieve letter if possible :)

## 2018-06-24 NOTE — Patient Instructions (Signed)
I will see you on May 18th at 1130 for a hemorrhoid banding!  Please call if any questions in the meantime.  It was a pleasure to see you today. I strive to create trusting relationships with patients to provide genuine, compassionate, and quality care. I value your feedback. If you receive a survey regarding your visit,  I greatly appreciate you taking time to fill this out.   Annitta Needs, PhD, ANP-BC [facility]

## 2018-06-25 NOTE — Progress Notes (Signed)
CC'D TO PCP °

## 2018-06-29 ENCOUNTER — Encounter: Payer: Self-pay | Admitting: Gastroenterology

## 2018-06-29 ENCOUNTER — Other Ambulatory Visit: Payer: Self-pay

## 2018-06-29 ENCOUNTER — Ambulatory Visit: Payer: 59 | Admitting: Gastroenterology

## 2018-06-29 VITALS — BP 150/87 | HR 78 | Temp 98.0°F | Ht 72.0 in | Wt 227.4 lb

## 2018-06-29 DIAGNOSIS — K641 Second degree hemorrhoids: Secondary | ICD-10-CM | POA: Diagnosis not present

## 2018-06-29 NOTE — Progress Notes (Signed)
[facility] Banding Note:    Pleasant 38 year old male with history of chronic rectal bleeding, s/p colonoscopy Dec 2019 with internal hemorrhoids. He has had bleeding, discomfort, leaking/soiling, itching. Virtual visit last week completed and discussed benefits of hemorrhoid banding. He desires to pursue this.   The patient presents with symptomatic grade 2 hemorrhoids, unresponsive to maximal medical therapy, requesting rubber band ligation of his hemorrhoidal disease. All risks, benefits, and alternative forms of therapy were described and informed consent was obtained.  The decision was made to band the right posterior internal hemorrhoid, and the [place] was used to perform band ligation without complication. Digital anorectal examination was then performed to assure proper positioning of the band, and no adjustment was needed. The patient was discharged home without pain or other issues. Dietary and behavioral recommendations were given, along with follow-up instructions. The patient will return in several weeks for followup and possible additional banding as required.  No complications were encountered and the patient tolerated the procedure well.  Annitta Needs, PhD, ANP-BC [facility]

## 2018-06-29 NOTE — Patient Instructions (Signed)
Avoid straining.  Take Benefiber 2 teaspoons daily.   Limit toilet time to 2-3 minutes!  Call me with any problems.   We can do the next banding in the next few weeks.   It was a pleasure to see you today. I strive to create trusting relationships with patients to provide genuine, compassionate, and quality care. I value your feedback. If you receive a survey regarding your visit,  I greatly appreciate you taking time to fill this out.   Annitta Needs, PhD, ANP-BC [facility]

## 2018-06-30 ENCOUNTER — Encounter: Payer: Self-pay | Admitting: Internal Medicine

## 2018-07-20 ENCOUNTER — Encounter: Payer: 59 | Admitting: Gastroenterology

## 2018-07-27 ENCOUNTER — Encounter: Payer: Self-pay | Admitting: Gastroenterology

## 2018-07-27 ENCOUNTER — Other Ambulatory Visit: Payer: Self-pay

## 2018-07-27 ENCOUNTER — Ambulatory Visit: Payer: 59 | Admitting: Gastroenterology

## 2018-07-27 VITALS — BP 130/80 | HR 73 | Temp 98.3°F | Ht 72.0 in | Wt 225.4 lb

## 2018-07-27 DIAGNOSIS — K641 Second degree hemorrhoids: Secondary | ICD-10-CM | POA: Diagnosis not present

## 2018-07-27 NOTE — Patient Instructions (Signed)
I will see you in several weeks, when it works for your schedule.  Avoid prolonged toilet time, and try to limit this to only 2-3 minutes.  Call if any concerns in the meantime!  I enjoyed seeing you again today! As you know, I value our relationship and want to provide genuine, compassionate, and quality care. I welcome your feedback. If you receive a survey regarding your visit,  I greatly appreciate you taking time to fill this out. See you next time!  Annitta Needs, PhD, ANP-BC [facility]

## 2018-07-27 NOTE — Progress Notes (Signed)
[facility] Banding Note:    38 year old male with history of chronic rectal bleeding, s/p colonoscopy Dec 2019 with internal hemorrhoids. He has had bleeding, discomfort, leaking/soiling, itching in the past. First banding completed Jun 29, 2018, of the right posterior internal hemorrhoid. He notes improvement thus far. Some increased bleeding recently. The left side feels more pronounced.   The patient presents with symptomatic grade 2  hemorrhoids, unresponsive to maximal medical therapy, requesting rubber band ligation of his/her hemorrhoidal disease. All risks, benefits, and alternative forms of therapy were described and informed consent was obtained.  The decision was made to band the left posterior  internal hemorrhoid, and the [place] was used to perform band ligation without complication. Digital anorectal examination was then performed to assure proper positioning of the band, and no adjustment was required. The patient was discharged home without pain or other issues. Dietary and behavioral recommendations were given, along with follow-up instructions. The patient will return in 2 weeks for followup and possible final additional banding as required.  No complications were encountered and the patient tolerated the procedure well.  Annitta Needs, PhD, ANP-BC [facility]

## 2018-08-11 ENCOUNTER — Other Ambulatory Visit: Payer: 59

## 2018-08-11 ENCOUNTER — Other Ambulatory Visit: Payer: Self-pay

## 2018-08-11 DIAGNOSIS — Z20822 Contact with and (suspected) exposure to covid-19: Secondary | ICD-10-CM

## 2018-08-15 ENCOUNTER — Telehealth: Payer: Self-pay

## 2018-08-15 LAB — NOVEL CORONAVIRUS, NAA: SARS-CoV-2, NAA: DETECTED — AB

## 2018-08-15 NOTE — Telephone Encounter (Signed)
Called [place] [facility] and LM on VM for [place].  Gave pt name, DOB, Address, Covid + result and telephone number.

## 2018-08-19 ENCOUNTER — Encounter: Payer: 59 | Admitting: Gastroenterology

## 2019-09-06 ENCOUNTER — Ambulatory Visit: Payer: 59 | Attending: Internal Medicine

## 2019-09-06 DIAGNOSIS — Z23 Encounter for immunization: Secondary | ICD-10-CM

## 2019-09-06 NOTE — Progress Notes (Unsigned)
   Covid-19 Vaccination Clinic  Name:  Jeremy Combs    MRN: [id number] DOB: 25-Nov-1980  09/06/2019  Jeremy Combs was observed post Covid-19 immunization for {COVID Vaccine Observation Times:23551} without incident. He was provided with Vaccine Information Sheet and instruction to access the V-Safe system.   Jeremy Combs was instructed to call 911 with any severe reactions post vaccine: Marland Kitchen Difficulty breathing  . Swelling of face and throat  . A fast heartbeat  . A bad rash all over body  . Dizziness and weakness    *** Covid vaccine administration is NOT RECORDED.  Must document administration and refresh note before signing ***

## 2019-09-06 NOTE — Progress Notes (Unsigned)
   Covid-19 Vaccination Clinic  Name:  Jeremy Combs    MRN: [id number] DOB: 06-Jul-1980  09/06/2019  Jeremy Combs was observed post Covid-19 immunization for 15 minutes without incident. He was provided with Vaccine Information Sheet and instruction to access the V-Safe system.   Jeremy Combs was instructed to call 911 with any severe reactions post vaccine: Marland Kitchen Difficulty breathing  . Swelling of face and throat  . A fast heartbeat  . A bad rash all over body  . Dizziness and weakness   Immunizations Administered    Name Date Dose VIS Date Route   Pfizer COVID-19 Vaccine 09/06/2019  3:55 PM 0.3 mL 04/07/2018 Intramuscular   Manufacturer: [place]   Lot: [id number]   [place]: [facility]  Name:  Jeremy Combs    MRN: [id number] DOB: [birth year]  09/06/2019  Jeremy Combs was observed post Covid-19 immunization for 15 minutes without incident. He was provided with Vaccine Information Sheet and instruction to access the V-Safe system.   Jeremy Combs was instructed to call 911 with any severe reactions post vaccine: Marland Kitchen Difficulty breathing  . Swelling of face and throat  . A fast heartbeat  . A bad rash all over body  . Dizziness and weakness   Immunizations Administered    Name Date Dose VIS Date Route   Pfizer COVID-19 Vaccine 09/06/2019  3:55 PM 0.3 mL 04/07/2018 Intramuscular   Manufacturer: [place]   Lot: [id number]   [place]: [id number]

## 2019-09-27 ENCOUNTER — Ambulatory Visit: Payer: 59 | Attending: Internal Medicine

## 2019-09-27 DIAGNOSIS — Z23 Encounter for immunization: Secondary | ICD-10-CM

## 2019-09-27 NOTE — Progress Notes (Signed)
   Covid-19 Vaccination Clinic  Name:  Jeremy Combs    MRN: [id number] DOB: 1981/01/26  09/27/2019  Mr. Knee was observed post Covid-19 immunization for 30 minutes based on pre-vaccination screening without incident. He was provided with Vaccine Information Sheet and instruction to access the V-Safe system.   [name] was instructed to call 911 with any severe reactions post vaccine: Marland Kitchen Difficulty breathing  . Swelling of face and throat  . A fast heartbeat  . A bad rash all over body  . Dizziness and weakness   Immunizations Administered    Name Date Dose VIS Date Route   Pfizer COVID-19 Vaccine 09/27/2019  2:25 PM 0.3 mL 04/07/2018 Intramuscular   Manufacturer: [place]   Lot: [id number]   [place]: [id number]

## 2020-05-21 ENCOUNTER — Other Ambulatory Visit: Payer: Self-pay

## 2020-05-21 ENCOUNTER — Encounter: Payer: Self-pay | Admitting: Emergency Medicine

## 2020-05-21 ENCOUNTER — Ambulatory Visit
Admission: EM | Admit: 2020-05-21 | Discharge: 2020-05-21 | Disposition: A | Discharge status: Home / Self Care | Payer: 59 | Attending: Physician Assistant | Admitting: Physician Assistant

## 2020-05-21 DIAGNOSIS — U071 COVID-19: Secondary | ICD-10-CM

## 2020-05-21 DIAGNOSIS — R059 Cough, unspecified: Secondary | ICD-10-CM

## 2020-05-21 NOTE — Discharge Instructions (Addendum)
Self isolate for 5 days from symptoms, then wear a mask around others for 5 days.

## 2020-05-21 NOTE — ED Triage Notes (Signed)
Body aches and congestion. Had positive at home covid test

## 2020-05-21 NOTE — ED Provider Notes (Signed)
[facility]    CSN: [id number] Arrival date & time: 05/21/20  1455      History   Chief Complaint Chief Complaint  Patient presents with  . Covid Positive    HPI Jeremy Combs is a 40 y.o. male.   Pt complains of cough, body aches, sore throat, chills that started 3 days ago.  Reports positive covid test.  He requests testing today to confirm.  He is taking ibuprofen and vitamin c.  Denies shortness of breath, wheezing. He has had his COVID vaccine, no booster.  Denies sick contacts.       Past Medical History:  Diagnosis Date  . Hypercholesterolemia     Patient Active Problem List   Diagnosis Date Noted  . Grade II hemorrhoids 06/29/2018  . Hemorrhoids 01/14/2018  . Rectal bleeding 01/14/2018    Past Surgical History:  Procedure Laterality Date  . COLONOSCOPY WITH PROPOFOL N/A 01/29/2018   Colonoscopy Dec 2019 with one 5 mm polyp in sigmoid colon s/p resection, non-bleeding internal hemorrhoids. Hyperplastic polyp. Next screening at age 16  . NO PAST SURGERIES    . POLYPECTOMY  01/29/2018   Procedure: POLYPECTOMY;  Surgeon: Daneil Dolin, MD;  [facility];  Service: Endoscopy;;  (colon)       Home Medications    Prior to Admission medications   Medication Sig Start Date End Date Taking? Authorizing Provider  ibuprofen (ADVIL,MOTRIN) 200 MG tablet Take 600 mg by mouth every 6 (six) hours as needed for headache or moderate pain.     [provider]    Family History Family History  Problem Relation Age of Onset  . Colon polyps Father 14  . Colon polyps Brother 81       Is going to need surgery to have them removed  . Colon cancer Neg Hx     Social History Social History   Tobacco Use  . Smoking status: Never Smoker  . Smokeless tobacco: Never Used  Vaping Use  . Vaping Use: Never used  Substance Use Topics  . Alcohol use: Yes    Comment: beer-less than 6 pack/week  . Drug use: Never     Allergies    Patient has no known allergies.   Review of Systems Review of Systems  Constitutional: Positive for chills. Negative for fever.  HENT: Positive for congestion and sore throat. Negative for ear pain.   Eyes: Negative for pain and visual disturbance.  Respiratory: Positive for cough. Negative for shortness of breath.   Cardiovascular: Negative for chest pain and palpitations.  Gastrointestinal: Negative for abdominal pain and vomiting.  Genitourinary: Negative for dysuria and hematuria.  Musculoskeletal: Positive for myalgias. Negative for arthralgias and back pain.  Skin: Negative for color change and rash.  Neurological: Negative for seizures and syncope.  All other systems reviewed and are negative.    Physical Exam Triage Vital Signs ED Triage Vitals  Enc Vitals Group     BP 05/21/20 1525 (!) 170/92     Pulse Rate 05/21/20 1525 90     Resp 05/21/20 1525 18     Temp 05/21/20 1525 97.9 F (36.6 C)     Temp Source 05/21/20 1525 Oral     SpO2 05/21/20 1525 95 %     Weight --      Height --      Head Circumference --      Peak Flow --      Pain Score 05/21/20 1527 0  Pain Loc --      Pain Edu? --      Excl. in [place]? --    No data found.  Updated Vital Signs BP (!) 170/92   Pulse 90   Temp 97.9 F (36.6 C) (Oral)   Resp 18   SpO2 95%   Visual Acuity Right Eye Distance:   Left Eye Distance:   Bilateral Distance:    Right Eye Near:   Left Eye Near:    Bilateral Near:     Physical Exam Vitals and nursing note reviewed.  Constitutional:      Appearance: He is well-developed.  HENT:     Head: Normocephalic and atraumatic.  Eyes:     Conjunctiva/sclera: Conjunctivae normal.  Cardiovascular:     Rate and Rhythm: Normal rate and regular rhythm.     Heart sounds: No murmur heard.   Pulmonary:     Effort: Pulmonary effort is normal. No respiratory distress.     Breath sounds: Normal breath sounds.  Abdominal:     Palpations: Abdomen is soft.      Tenderness: There is no abdominal tenderness.  Musculoskeletal:     Cervical back: Neck supple.  Skin:    General: Skin is warm and dry.  Neurological:     Mental Status: He is alert.      UC Treatments / Results  Labs (all labs ordered are listed, but only abnormal results are displayed) Labs Reviewed  COVID-19, FLU A+B NAA    EKG   Radiology No results found.  Procedures Procedures (including critical care time)  Medications Ordered in UC Medications - No data to display  Initial Impression / Assessment and Plan / UC Course  I have reviewed the triage vital signs and the nursing notes.  Pertinent labs & imaging results that were available during my care of the patient were reviewed by me and considered in my medical decision making (see chart for details).     COVID positive.  Pt well appearing, vitals WNL, O2 95%. He will continue with supportive treatment at home.  Discussed self isolation.  Strict return precautions given.  Final Clinical Impressions(s) / UC Diagnoses   Final diagnoses:  Cough   Discharge Instructions   None    ED Prescriptions    None     PDMP not reviewed this encounter.   Konrad Felix, PA-C 05/24/20 1503

## 2020-05-22 LAB — COVID-19, FLU A+B NAA
Influenza A, NAA: NOT DETECTED
Influenza B, NAA: NOT DETECTED
SARS-CoV-2, NAA: DETECTED — AB

## 2021-01-05 ENCOUNTER — Other Ambulatory Visit (HOSPITAL_COMMUNITY): Payer: Self-pay | Admitting: Family Medicine

## 2021-01-05 ENCOUNTER — Other Ambulatory Visit: Payer: Self-pay | Admitting: Family Medicine

## 2021-01-05 DIAGNOSIS — N50819 Testicular pain, unspecified: Secondary | ICD-10-CM

## 2021-01-15 ENCOUNTER — Other Ambulatory Visit: Payer: Self-pay

## 2021-01-15 ENCOUNTER — Ambulatory Visit (HOSPITAL_COMMUNITY)
Admission: RE | Admit: 2021-01-15 | Discharge: 2021-01-15 | Disposition: A | Discharge status: Home / Self Care | Payer: 59 | Source: Ambulatory Visit | Attending: Family Medicine | Admitting: Family Medicine

## 2021-01-15 DIAGNOSIS — N50819 Testicular pain, unspecified: Secondary | ICD-10-CM | POA: Insufficient documentation

## 2021-01-17 DIAGNOSIS — N433 Hydrocele, unspecified: Secondary | ICD-10-CM | POA: Insufficient documentation

## 2021-05-14 ENCOUNTER — Emergency Department (HOSPITAL_COMMUNITY)
Admission: EM | Admit: 2021-05-14 | Discharge: 2021-05-14 | Disposition: A | Discharge status: Home / Self Care | Payer: 59 | Attending: Emergency Medicine | Admitting: Emergency Medicine

## 2021-05-14 ENCOUNTER — Encounter (HOSPITAL_COMMUNITY): Payer: Self-pay | Admitting: Emergency Medicine

## 2021-05-14 ENCOUNTER — Emergency Department (HOSPITAL_COMMUNITY): Payer: 59

## 2021-05-14 DIAGNOSIS — R0789 Other chest pain: Secondary | ICD-10-CM | POA: Diagnosis not present

## 2021-05-14 DIAGNOSIS — R079 Chest pain, unspecified: Secondary | ICD-10-CM

## 2021-05-14 LAB — COMPREHENSIVE METABOLIC PANEL
ALT: 80 U/L — ABNORMAL HIGH (ref 0–44)
AST: 66 U/L — ABNORMAL HIGH (ref 15–41)
Albumin: 4.3 g/dL (ref 3.5–5.0)
Alkaline Phosphatase: 41 U/L (ref 38–126)
Anion gap: 10 (ref 5–15)
BUN: 20 mg/dL (ref 6–20)
CO2: 24 mmol/L (ref 22–32)
Calcium: 9.3 mg/dL (ref 8.9–10.3)
Chloride: 104 mmol/L (ref 98–111)
Creatinine, Ser: 1.11 mg/dL (ref 0.61–1.24)
GFR, Estimated: >60 mL/min (ref >60)
Glucose, Bld: 131 mg/dL — ABNORMAL HIGH (ref 70–99)
Potassium: 3.4 mmol/L — ABNORMAL LOW (ref 3.5–5.1)
Sodium: 138 mmol/L (ref 135–145)
Total Bilirubin: 0.4 mg/dL (ref 0.3–1.2)
Total Protein: 7.5 g/dL (ref 6.5–8.1)

## 2021-05-14 LAB — CBC WITH DIFFERENTIAL/PLATELET
Abs Immature Granulocytes: 0.03 10*3/uL (ref 0.00–0.07)
Basophils Absolute: 0.1 10*3/uL (ref 0.0–0.1)
Basophils Relative: 1 %
Eosinophils Absolute: 0.2 10*3/uL (ref 0.0–0.5)
Eosinophils Relative: 3 %
HCT: 43.6 % (ref 39.0–52.0)
Hemoglobin: 15.1 g/dL (ref 13.0–17.0)
Immature Granulocytes: 0 %
Lymphocytes Relative: 31 %
Lymphs Abs: 2.7 10*3/uL (ref 0.7–4.0)
MCH: 29.7 pg (ref 26.0–34.0)
MCHC: 34.6 g/dL (ref 30.0–36.0)
MCV: 85.7 fL (ref 80.0–100.0)
Monocytes Absolute: 0.7 10*3/uL (ref 0.1–1.0)
Monocytes Relative: 8 %
Neutro Abs: 5 10*3/uL (ref 1.7–7.7)
Neutrophils Relative %: 57 %
Platelets: 242 10*3/uL (ref 150–400)
RBC: 5.09 MIL/uL (ref 4.22–5.81)
RDW: 12.4 % (ref 11.5–15.5)
WBC: 8.7 10*3/uL (ref 4.0–10.5)
nRBC: 0 % (ref 0.0–0.2)

## 2021-05-14 LAB — TROPONIN I (HIGH SENSITIVITY)
Troponin I (High Sensitivity): 8 ng/L (ref <18)
Troponin I (High Sensitivity): 8 ng/L (ref <18)

## 2021-05-14 LAB — LIPASE, BLOOD: Lipase: 34 U/L (ref 11–51)

## 2021-05-14 NOTE — ED Provider Notes (Signed)
?[facility] ?Provider Note ? ? ?CSN: [id number] ?Arrival date & time: 05/14/21  2094 ? ?  ? ?History ? ?Chief Complaint  ?Patient presents with  ? Chest Pain  ? ? ?Lucian Baswell is a 41 y.o. male. ? ?Patient presents to the emergency department for evaluation of chest pain.  Patient reports that he was awakened from sleep by chest pain.  Patient reports a constant dull ache with intermittent sharp pains.  Pain was at its worst when he woke up, has slowly improved.  Initially it was affecting his breathing but he is no longer feeling any shortness of breath.  No nausea or diaphoresis.  Father had early heart disease, no other cardiac risk factors. ? ? ?  ? ?Home Medications ?Prior to Admission medications   ?Medication Sig Start Date End Date Taking? Authorizing Provider  ?ibuprofen (ADVIL,MOTRIN) 200 MG tablet Take 600 mg by mouth every 6 (six) hours as needed for headache or moderate pain.     [provider]  ?   ? ?Allergies    ?Patient has no known allergies.   ? ?Review of Systems   ?Review of Systems  ?Cardiovascular:  Positive for chest pain.  ? ?Physical Exam ?Updated Vital Signs ?BP 122/75   Pulse (!) 59   Temp 97.9 ?F (36.6 ?C) (Oral)   Resp 16   Ht 6' (1.829 m)   Wt 95.3 kg   SpO2 96%   BMI 28.48 kg/m?  ?Physical Exam ?Vitals and nursing note reviewed.  ?Constitutional:   ?   General: He is not in acute distress. ?   Appearance: He is well-developed.  ?HENT:  ?   Head: Normocephalic and atraumatic.  ?   Mouth/Throat:  ?   Mouth: Mucous membranes are moist.  ?Eyes:  ?   General: Vision grossly intact. Gaze aligned appropriately.  ?   Extraocular Movements: Extraocular movements intact.  ?   Conjunctiva/sclera: Conjunctivae normal.  ?Cardiovascular:  ?   Rate and Rhythm: Normal rate and regular rhythm.  ?   Pulses: Normal pulses.  ?   Heart sounds: Normal heart sounds, S1 normal and S2 normal. No murmur heard. ?  No friction rub. No gallop.  ?Pulmonary:  ?   Effort:  Pulmonary effort is normal. No respiratory distress.  ?   Breath sounds: Normal breath sounds.  ?Chest:  ?   Chest wall: Tenderness present.  ? ? ?Abdominal:  ?   Palpations: Abdomen is soft.  ?   Tenderness: There is no abdominal tenderness. There is no guarding or rebound.  ?   Hernia: No hernia is present.  ?Musculoskeletal:     ?   General: No swelling.  ?   Cervical back: Full passive range of motion without pain, normal range of motion and neck supple. No pain with movement, spinous process tenderness or muscular tenderness. Normal range of motion.  ?   Right lower leg: No edema.  ?   Left lower leg: No edema.  ?Skin: ?   General: Skin is warm and dry.  ?   Capillary Refill: Capillary refill takes less than 2 seconds.  ?   Findings: No ecchymosis, erythema, lesion or wound.  ?Neurological:  ?   Mental Status: He is alert and oriented to person, place, and time.  ?   GCS: GCS eye subscore is 4. GCS verbal subscore is 5. GCS motor subscore is 6.  ?   Cranial Nerves: Cranial nerves 2-12 are intact.  ?  Sensory: Sensation is intact.  ?   Motor: Motor function is intact. No weakness or abnormal muscle tone.  ?   Coordination: Coordination is intact.  ?Psychiatric:     ?   Mood and Affect: Mood normal.     ?   Speech: Speech normal.     ?   Behavior: Behavior normal.  ? ? ?ED Results / Procedures / Treatments   ?Labs ?(all labs ordered are listed, but only abnormal results are displayed) ?Labs Reviewed  ?COMPREHENSIVE METABOLIC PANEL - Abnormal; Notable for the following components:  ?    Result Value  ? Potassium 3.4 (*)   ? Glucose, Bld 131 (*)   ? AST 66 (*)   ? ALT 80 (*)   ? All other components within normal limits  ?CBC WITH DIFFERENTIAL/PLATELET  ?LIPASE, BLOOD  ?TROPONIN I (HIGH SENSITIVITY)  ?TROPONIN I (HIGH SENSITIVITY)  ? ? ?EKG ?EKG Interpretation ? ?Date/Time:  Monday May 14 2021 00:41:48 EDT ?Ventricular Rate:  80 ?PR Interval:  166 ?QRS Duration: 94 ?QT Interval:  341 ?QTC Calculation: 394 ?R  Axis:   93 ?Text Interpretation: Sinus rhythm Borderline right axis deviation Nonspecific ST abnormality No previous tracing Confirmed by Orpah Greek [phone]) on 05/14/2021 12:45:49 AM ? ?Radiology ?DG Chest Port 1 View ? ?Result Date: 05/14/2021 ?CLINICAL DATA:  Chest pain EXAM: PORTABLE CHEST 1 VIEW COMPARISON:  None. FINDINGS: The heart size and mediastinal contours are within normal limits. Both lungs are clear. The visualized skeletal structures are unremarkable. IMPRESSION: Normal study. Electronically Signed   By: Rolm Baptise M.D.   On: 05/14/2021 02:10   ? ?Procedures ?Procedures  ? ? ?Medications Ordered in ED ?Medications - No data to display ? ?ED Course/ Medical Decision Making/ A&P ?  ?                        ?Medical Decision Making ?Amount and/or Complexity of Data Reviewed ?Labs: ordered. ?Radiology: ordered. ? ? ?Patient presents to the emergency department for evaluation of chest pain.  Patient was awakened from sleep by chest pain.  Patient experiencing a dull aching pain in the left chest with intermittent sharp stabbing pains. ? ?Patient felt to have a low likelihood of cardiac etiology.  He is active, works out including runs and never has exertional chest pain.  His only cardiac risk factor is family history of early heart disease.  Symptoms are atypical.  EKG with nonspecific changes, no baseline EKG available.  He did, however, have 2 negative troponins.  He is PERC negative.  Chest x-ray is clear.  At this point I do not think the patient requires hospitalization.  With a family history of heart disease and nonspecific ST changes on his EKG, however, will recommend cardiology follow-up. ? ? ? ? ? ? ? ?Final Clinical Impression(s) / ED Diagnoses ?Final diagnoses:  ?Nonspecific chest pain  ? ? ?Rx / DC Orders ?ED Discharge Orders   ? ?      Ordered  ?  Ambulatory referral to Cardiology       ? 05/14/21 0445  ? ?  ?  ? ?  ? ? ?  ?Orpah Greek, MD ?05/14/21 [phone] ? ?

## 2021-05-14 NOTE — ED Triage Notes (Signed)
Pt c/o chest pain that radiates down left arm for the past 45 mins. Pt states he was asleep when the pain started.  ? ?Pt took '325mg'$  ASA and Advil prior to arrival.  ?

## 2021-06-14 NOTE — Progress Notes (Deleted)
?Cardiology Office Note:   ? ?Date:  06/14/2021  ? ?ID:  Jeremy Combs, DOB 1980/11/16, MRN [id number] ? ?PCP:  Celene Squibb, MD ?  ?[facility] Providers ?Cardiologist:  None { ? ? ?Referring MD: Orpah Greek,*  ? ? ?History of Present Illness:   ? ?Jeremy Combs is a 41 y.o. male with a hx of HLD who was referred by Dr. Betsey Holiday for further evaluation of chest pain. ? ?Patient was seen in [facility] ER on 05/14/21 for an episode of chest pain that awoke him from sleep. Note reviewed. Pain was described as a dull aching pain in the center of his chest with intermittent stabbing pain. Symptoms were not exertional or positional. Only risk factor was early CAD in his father. Work-up in the ER reassuring with normal trop x2, ECG nonischemic, CXR unremarkable. He was recommended for Cardiology follow-up. ? ?Today, *** ? ?Past Medical History:  ?Diagnosis Date  ? Hypercholesterolemia   ? ? ?Past Surgical History:  ?Procedure Laterality Date  ? COLONOSCOPY WITH PROPOFOL N/A 01/29/2018  ? Colonoscopy Dec 2019 with one 5 mm polyp in sigmoid colon s/p resection, non-bleeding internal hemorrhoids. Hyperplastic polyp. Next screening at age 49  ? NO PAST SURGERIES    ? POLYPECTOMY  01/29/2018  ? Procedure: POLYPECTOMY;  Surgeon: Daneil Dolin, MD;  [facility];  Service: Endoscopy;;  (colon)  ? ? ?Current Medications: ?No outpatient medications have been marked as taking for the 06/18/21 encounter (Appointment) with Freada Bergeron, MD.  ?  ? ?Allergies:   Patient has no known allergies.  ? ?Social History  ? ?Socioeconomic History  ? Marital status: Married  ?  Spouse name: Not on file  ? Number of children: Not on file  ? Years of education: Not on file  ? Highest education level: Not on file  ?Occupational History  ? Not on file  ?Tobacco Use  ? Smoking status: Never  ? Smokeless tobacco: Never  ?Vaping Use  ? Vaping Use: Never used  ?Substance and Sexual Activity  ? Alcohol use: Yes  ?  Comment:  beer-less than 6 pack/week  ? Drug use: Never  ? Sexual activity: Yes  ?  Birth control/protection: None  ?Other Topics Concern  ? Not on file  ?Social History Narrative  ? Not on file  ? ?Social Determinants of Health  ? ?Financial Resource Strain: Not on file  ?Food Insecurity: Not on file  ?Transportation Needs: Not on file  ?Physical Activity: Not on file  ?Stress: Not on file  ?Social Connections: Not on file  ?  ? ?Family History: ?The patient's ***family history includes Colon polyps (age of onset: 17) in his brother; Colon polyps (age of onset: 71) in his father. There is no history of Colon cancer. ? ?ROS:   ?Please see the history of present illness.    ?*** All other systems reviewed and are negative. ? ?EKGs/Labs/Other Studies Reviewed:   ? ?The following studies were reviewed today: ?*** ? ?EKG:  EKG is *** ordered today.  The ekg ordered today demonstrates *** ? ?Recent Labs: ?05/14/2021: ALT 80; BUN 20; Creatinine, Ser 1.11; Hemoglobin 15.1; Platelets 242; Potassium 3.4; Sodium 138  ?Recent Lipid Panel ?No results found for: CHOL, TRIG, HDL, CHOLHDL, VLDL, LDLCALC, LDLDIRECT ? ? ?Risk Assessment/Calculations:   ?{Does this patient have ATRIAL FIBRILLATION?[phone]} ? ?    ? ?Physical Exam:   ? ?VS:  There were no vitals taken for this visit.   ? ?  Wt Readings from Last 3 Encounters:  ?05/14/21 210 lb (95.3 kg)  ?07/27/18 225 lb 6.4 oz (102.2 kg)  ?06/29/18 227 lb 6.4 oz (103.1 kg)  ?  ? ?GEN: *** Well nourished, well developed in no acute distress ?HEENT: Normal ?NECK: No JVD; No carotid bruits ?LYMPHATICS: No lymphadenopathy ?CARDIAC: ***RRR, no murmurs, rubs, gallops ?RESPIRATORY:  Clear to auscultation without rales, wheezing or rhonchi  ?ABDOMEN: Soft, non-tender, non-distended ?MUSCULOSKELETAL:  No edema; No deformity  ?SKIN: Warm and dry ?NEUROLOGIC:  Alert and oriented x 3 ?PSYCHIATRIC:  Normal affect  ? ?ASSESSMENT:   ? ?No diagnosis found. ?PLAN:   ? ?In order of problems listed  above: ? ?#Chest Pain: ?Atypical in nature with reassuring work-up in the ER. Only risk factor is early CAD in his father. Will check Ca score for risk stratification. ?-Check Ca score ? ?   ? ?{Are you ordering a CV Procedure (e.g. stress test, cath, DCCV, TEE, etc)?   Press F2        :[id number]}  ? ? ?Medication Adjustments/Labs and Tests Ordered: ?Current medicines are reviewed at length with the patient today.  Concerns regarding medicines are outlined above.  ?No orders of the defined types were placed in this encounter. ? ?No orders of the defined types were placed in this encounter. ? ? ?There are no Patient Instructions on file for this visit.  ? ?Signed, ?Freada Bergeron, MD  ?06/14/2021 8:24 PM    ?[place] ?

## 2021-06-18 ENCOUNTER — Encounter: Payer: Self-pay | Admitting: Cardiology

## 2021-06-18 ENCOUNTER — Ambulatory Visit: Payer: 59 | Admitting: Cardiology

## 2021-06-18 VITALS — BP 146/80 | HR 86 | Ht 72.0 in | Wt 214.2 lb

## 2021-06-18 DIAGNOSIS — Z8249 Family history of ischemic heart disease and other diseases of the circulatory system: Secondary | ICD-10-CM

## 2021-06-18 DIAGNOSIS — R072 Precordial pain: Secondary | ICD-10-CM | POA: Diagnosis not present

## 2021-06-18 NOTE — Patient Instructions (Addendum)
Medication Instructions:  ?Your physician recommends that you continue on your current medications as directed. Please refer to the Current Medication list given to you today. ? ?*If you need a refill on your cardiac medications before your next appointment, please call your pharmacy* ? ? ?Lab Work: ?none ?If you have labs (blood work) drawn today and your tests are completely normal, you will receive your results only by: ?MyChart Message (if you have MyChart) OR ?A paper copy in the mail ?If you have any lab test that is abnormal or we need to change your treatment, we will call you to review the results. ? ? ?Testing/Procedures: ? ?Calcium Score  ? ? ?Follow-Up: ?At [facility], you and your health needs are our priority.  As part of our continuing mission to provide you with exceptional heart care, we have created designated Provider Care Teams.  These Care Teams include your primary Cardiologist (physician) and Advanced Practice Providers (APPs -  Physician Assistants and Nurse Practitioners) who all work together to provide you with the care you need, when you need it. ? ?We recommend signing up for the patient portal called "MyChart".  Sign up information is provided on this After Visit Summary.  MyChart is used to connect with patients for Virtual Visits (Telemedicine).  Patients are able to view lab/test results, encounter notes, upcoming appointments, etc.  Non-urgent messages can be sent to your provider as well.   ?To learn more about what you can do with MyChart, go to [email or web address].   ? ?Your next appointment:   ?1 year(s) ? ?The format for your next appointment:   ?In Person ? ?Provider:   ?Dr Gwyndolyn Kaufman  ? ?If primary card or EP is not listed click here to update    :1}  ? ? ?Other Instructions ? ? ? ?Important Information About Sugar ? ? ? ? ?  ?

## 2021-06-18 NOTE — Progress Notes (Signed)
?Cardiology Office Note:   ? ?Date:  06/18/2021  ? ?ID:  Jeremy Combs, DOB [birth year], MRN [id number] ? ?PCP:  Celene Squibb, MD ?  ?[facility] Providers ?Cardiologist:  None { ? ? ?Referring MD: Orpah Greek,*  ? ? ?History of Present Illness:   ? ?Jeremy Combs is a 41 y.o. male with a hx of HLD who was referred by Dr. Betsey Holiday for further evaluation of chest pain. ? ?Patient was seen in [facility] ER on 05/14/21 for an episode of chest pain that awoke him from sleep. Note reviewed. Pain was described as a dull aching pain in the center of his chest with intermittent stabbing pain. Symptoms were not exertional or positional. Only risk factor was early CAD in his father. Work-up in the ER reassuring with normal trop x2, ECG nonischemic, CXR unremarkable. He was recommended for Cardiology follow-up. ? ?He is accompanied by a family member. Today, the patient states that he has not had any recurrent episodes of chest pain since the ER visit. ? ?Preceding that ER visit he woke up 2.5 hours after falling asleep with chest pain. He drank water and walked around. After 20 minutes the pain was still not resolving and he opted to go to the hospital. He describes his chest pain as "like someone was sitting on his chest." The pain radiated across from his mid chest to his left shoulder. Associated symptoms included shortness of breath and near-syncope; no nausea or diaphoresis. However, he believes he felt like he was going to pass out during the ride to the hospital because of his anxiety and fear rather than the pain. ? ?After that day, he continued to notice persistent chest tightness that gradually waned and resolved over a few days.  ? ?Generally he does well while exercising, with no anginal symptoms. He routinely lifts weights for up to 1 hour. Two weeks ago he went hiking for 2 hours with no significant symptoms.  ? ?In his family, his father and mother both had a heart attack in their early 20's. Both have  diabetes and are known heavy smokers. His brother has diabetes. His paternal grandmother died suddenly of a massive heart attack in her mid 74's. ? ?He denies any palpitations, or peripheral edema. No lightheadedness, headaches, orthopnea, or PND. ? ? ?Past Medical History:  ?Diagnosis Date  ? Hypercholesterolemia   ? ? ?Past Surgical History:  ?Procedure Laterality Date  ? COLONOSCOPY WITH PROPOFOL N/A 01/29/2018  ? Colonoscopy Dec 2019 with one 5 mm polyp in sigmoid colon s/p resection, non-bleeding internal hemorrhoids. Hyperplastic polyp. Next screening at age 38  ? NO PAST SURGERIES    ? POLYPECTOMY  01/29/2018  ? Procedure: POLYPECTOMY;  Surgeon: Daneil Dolin, MD;  [facility]: [facility];  Service: Endoscopy;;  (colon)  ? ? ?Current Medications: ?Current Meds  ?Medication Sig  ? ibuprofen (ADVIL,MOTRIN) 200 MG tablet Take 600 mg by mouth every 6 (six) hours as needed for headache or moderate pain.   ?  ? ?Allergies:   Patient has no known allergies.  ? ?Social History  ? ?Socioeconomic History  ? Marital status: Married  ?  Spouse name: Not on file  ? Number of children: Not on file  ? Years of education: Not on file  ? Highest education level: Not on file  ?Occupational History  ? Not on file  ?Tobacco Use  ? Smoking status: Never  ? Smokeless tobacco: Never  ?Vaping Use  ? Vaping Use: Never  used  ?Substance and Sexual Activity  ? Alcohol use: Yes  ?  Comment: beer-less than 6 pack/week  ? Drug use: Never  ? Sexual activity: Yes  ?  Birth control/protection: None  ?Other Topics Concern  ? Not on file  ?Social History Narrative  ? Not on file  ? ?Social Determinants of Health  ? ?Financial Resource Strain: Not on file  ?Food Insecurity: Not on file  ?Transportation Needs: Not on file  ?Physical Activity: Not on file  ?Stress: Not on file  ?Social Connections: Not on file  ?  ? ?Family History: ?The patient's family history includes Colon polyps (age of onset: 82) in his brother; Colon polyps (age of onset:  50) in his father. There is no history of Colon cancer. ? ?ROS:   ?Review of Systems  ?Constitutional:  Negative for chills and fever.  ?HENT:  Negative for hearing loss and sore throat.   ?Eyes:  Negative for photophobia and discharge.  ?Respiratory:  Positive for shortness of breath (With chest pain; None otherwise). Negative for cough and hemoptysis.   ?Cardiovascular:  Positive for chest pain (None since ER visit). Negative for palpitations, orthopnea, claudication, leg swelling and PND.  ?Gastrointestinal:  Negative for abdominal pain and heartburn.  ?Genitourinary:  Negative for flank pain.  ?Musculoskeletal:  Negative for back pain.  ?Neurological:  Negative for seizures and headaches.  ?Endo/Heme/Allergies:  Negative for polydipsia.  ?Psychiatric/Behavioral:  Negative for depression and suicidal ideas.   ? ? ?EKGs/Labs/Other Studies Reviewed:   ? ?The following studies were reviewed today: ? ?Chest X-Ray 05/14/2021: ?FINDINGS: ?The heart size and mediastinal contours are within normal limits. ?Both lungs are clear. The visualized skeletal structures are ?unremarkable. ?  ?IMPRESSION: ?Normal study. ? ?EKG:  EKG is personally reviewed. ?06/18/2021: EKG was not ordered. ?05/14/2021 (ED): Sinus rhythm at 80 bpm. Borderline right axis deviation. Nonspecific ST abnormality.  ? ?Recent Labs: ?05/14/2021: ALT 80; BUN 20; Creatinine, Ser 1.11; Hemoglobin 15.1; Platelets 242; Potassium 3.4; Sodium 138  ? ?Recent Lipid Panel ?No results found for: CHOL, TRIG, HDL, CHOLHDL, VLDL, LDLCALC, LDLDIRECT ? ? ?Risk Assessment/Calculations:   ?  ? ?    ? ?Physical Exam:   ? ?VS:  BP (!) 146/80   Pulse 86   Ht 6' (1.829 m)   Wt 214 lb 3.2 oz (97.2 kg)   SpO2 95%   BMI 29.05 kg/m?    ? ?Wt Readings from Last 3 Encounters:  ?06/18/21 214 lb 3.2 oz (97.2 kg)  ?05/14/21 210 lb (95.3 kg)  ?07/27/18 225 lb 6.4 oz (102.2 kg)  ?  ? ?GEN: Well nourished, well developed in no acute distress ?HEENT: Normal ?NECK: No JVD; No carotid  bruits ?CARDIAC: RRR, no murmurs, rubs, gallops ?RESPIRATORY:  Clear to auscultation without rales, wheezing or rhonchi  ?ABDOMEN: Soft, non-tender, non-distended ?MUSCULOSKELETAL:  No edema; No deformity  ?SKIN: Warm and dry ?NEUROLOGIC:  Alert and oriented x 3 ?PSYCHIATRIC:  Normal affect  ? ?ASSESSMENT:   ? ?1. Precordial pain   ?2. Family history of premature CAD   ? ?PLAN:   ? ?In order of problems listed above: ? ?#Chest Pain: ?#Family History of Premature CAD: ?Unlikely to be cardiac as no exertional symptoms and took a couple of days to resolve. ER work-up reassuringly normal. Given family history of premature CAD and elevated LDL cholesterol to 130s, will check coronary Ca score for risk stratification.  ?-Check Ca score ?-Continue lifestyle modifications ? ?Exercise recommendations: ?Goal of exercising  for at least 30 minutes a day, at least 5 times per week.  Please exercise to a moderate exertion.  This means that while exercising it is difficult to speak in full sentences, however you are not so short of breath that you feel you must stop, and not so comfortable that you can carry on a full conversation.  Exertion level should be approximately a 5/10, if 10 is the most exertion you can perform. ? ?Diet recommendations: ?Recommend a heart healthy diet such as the Mediterranean diet.  This diet consists of plant based foods, healthy fats, lean meats, olive oil.  It suggests limiting the intake of simple carbohydrates such as white breads, pastries, and pastas.  It also limits the amount of red meat, wine, and dairy products such as cheese that one should consume on a daily basis.  ? ?   ? ?   ?Follow-up:  1 year. ? ?Medication Adjustments/Labs and Tests Ordered: ?Current medicines are reviewed at length with the patient today.  Concerns regarding medicines are outlined above.  ? ?Orders Placed This Encounter  ?Procedures  ? CT CARDIAC SCORING  ? ?No orders of the defined types were placed in this  encounter. ? ?Patient Instructions  ?Medication Instructions:  ?Your physician recommends that you continue on your current medications as directed. Please refer to the Current Medication list given to you today. ? ?*If you n

## 2021-08-06 ENCOUNTER — Ambulatory Visit
Admission: RE | Admit: 2021-08-06 | Discharge: 2021-08-06 | Disposition: A | Discharge status: Home / Self Care | Payer: Self-pay | Source: Ambulatory Visit | Attending: Cardiology | Admitting: Cardiology

## 2021-08-06 DIAGNOSIS — R072 Precordial pain: Secondary | ICD-10-CM

## 2023-07-01 IMAGING — US US SCROTUM W/ DOPPLER COMPLETE
1 series · 14 of 25 positions shown · non-contrast
Comparison: None.

CLINICAL DATA: Testicle pain

EXAM:
SCROTAL ULTRASOUND
DOPPLER ULTRASOUND OF THE TESTICLES
TECHNIQUE: Complete ultrasound examination of the testicles, epididymis, and
other scrotal structures was performed. Color and spectral Doppler
ultrasound were also utilized to evaluate blood flow to the
testicles.

[Series 1: us scrotum w/doppler · 14 of 68 slices shown]
[im 1/68]
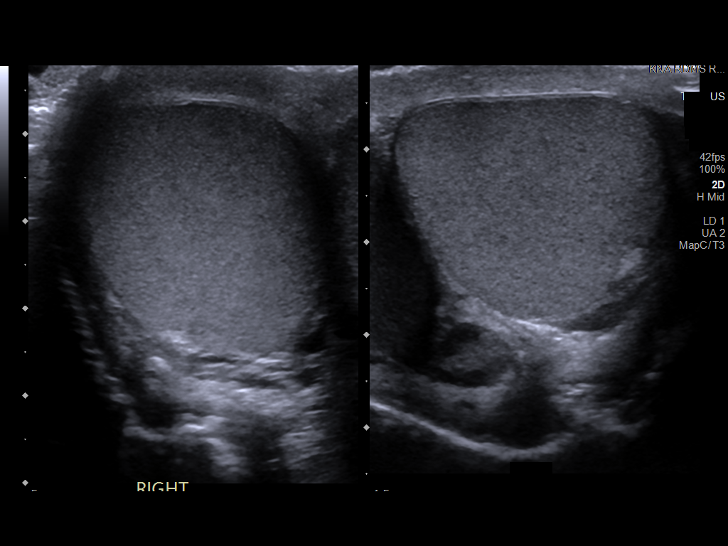
[im 6/68]
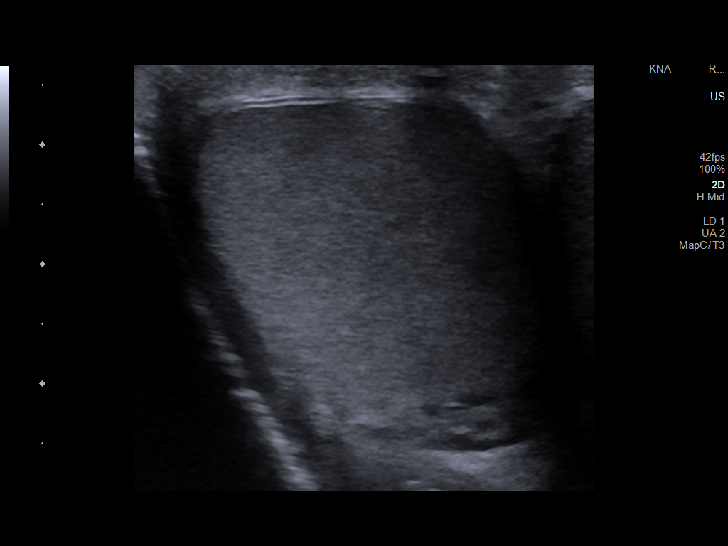
[im 12/68]
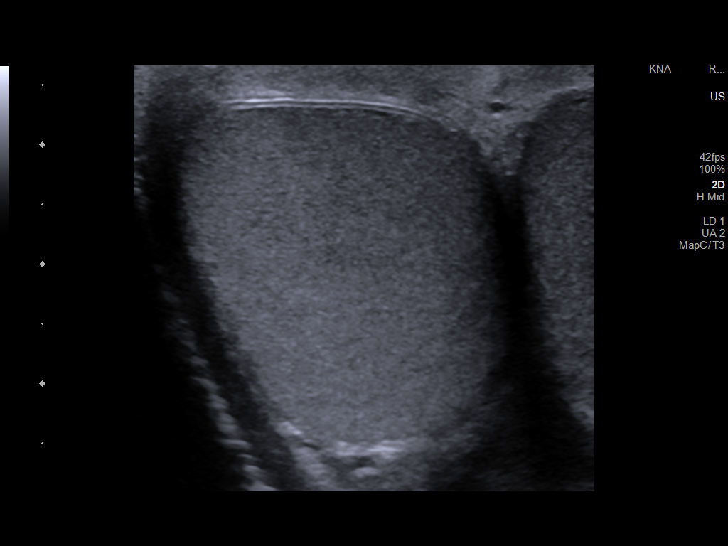
[im 17/68]
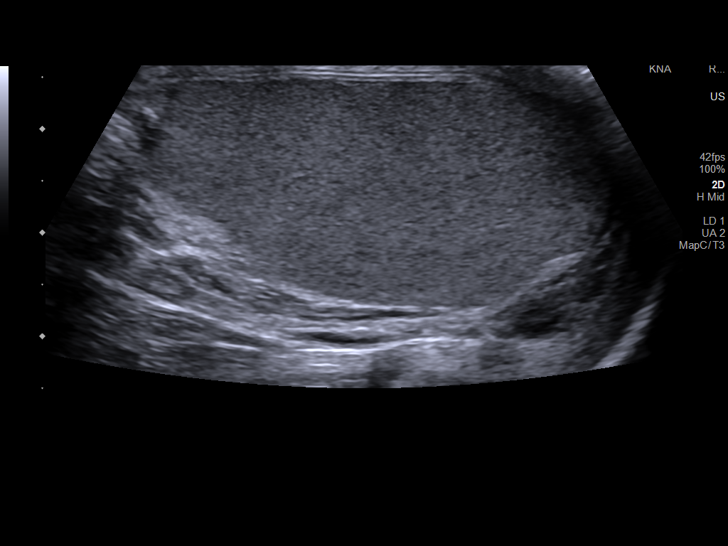
[im 23/68]
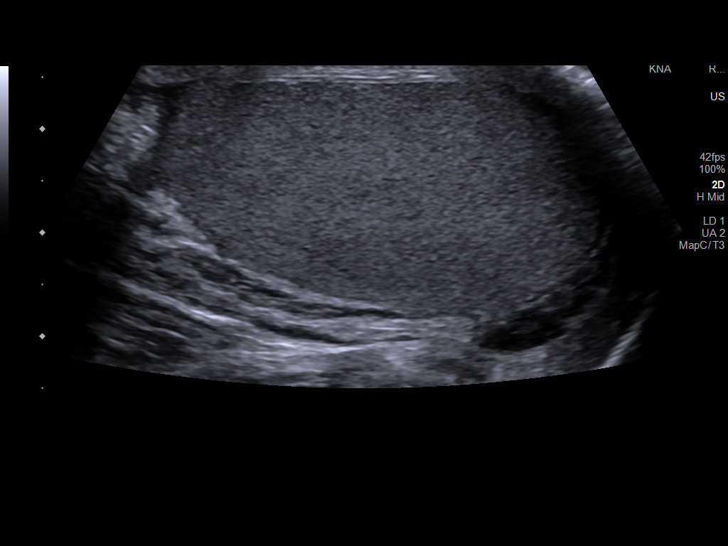
[im 26/68]
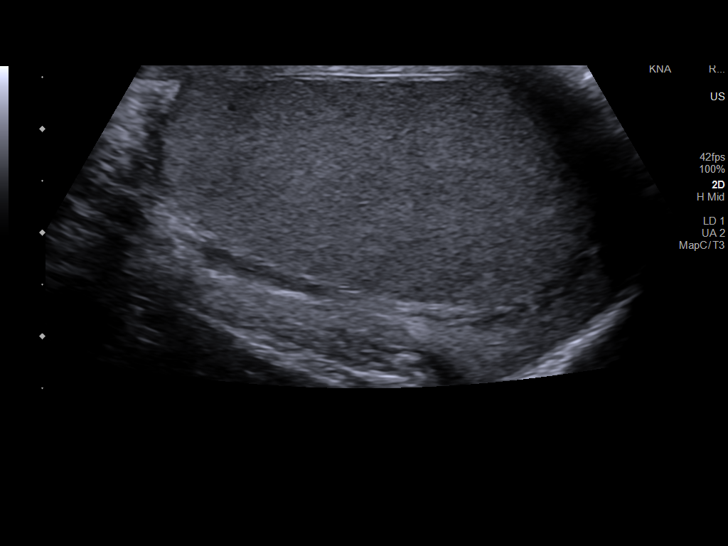
[im 31/68]
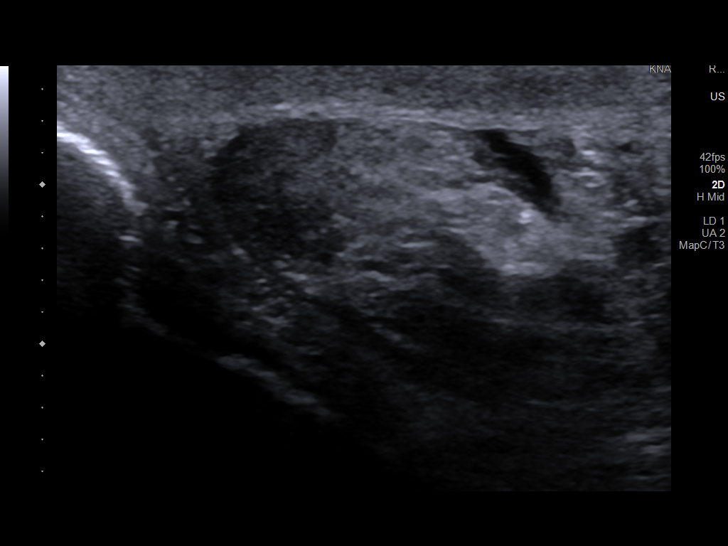
[im 37/68]
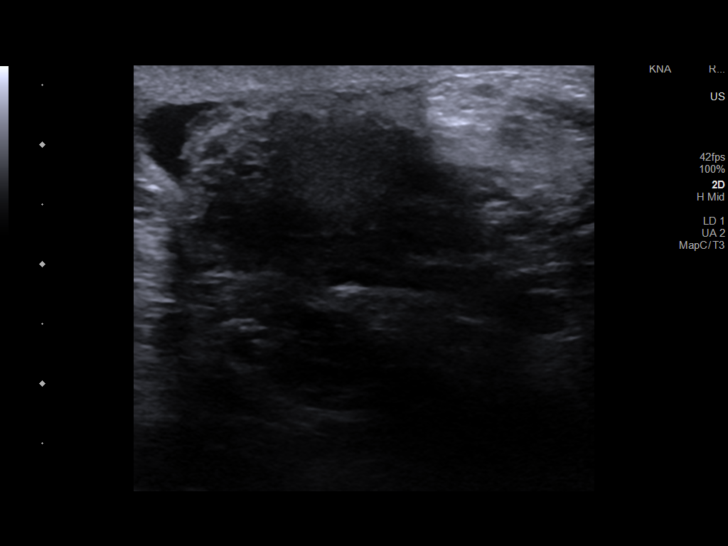
[im 42/68]
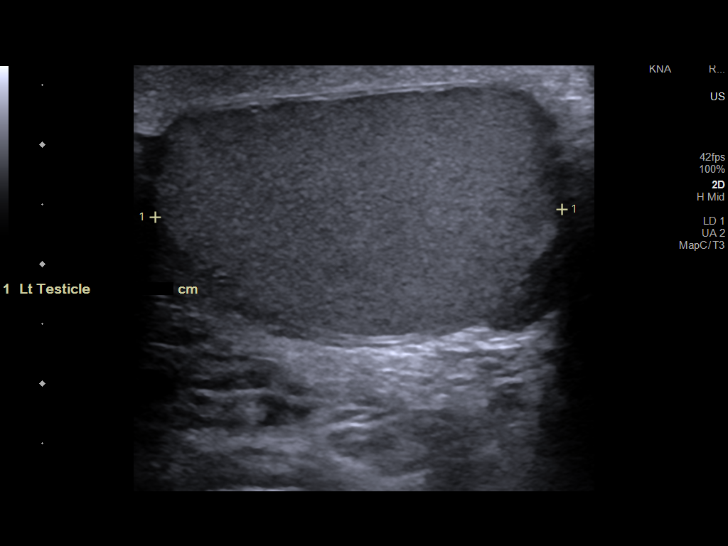
[im 45/68]
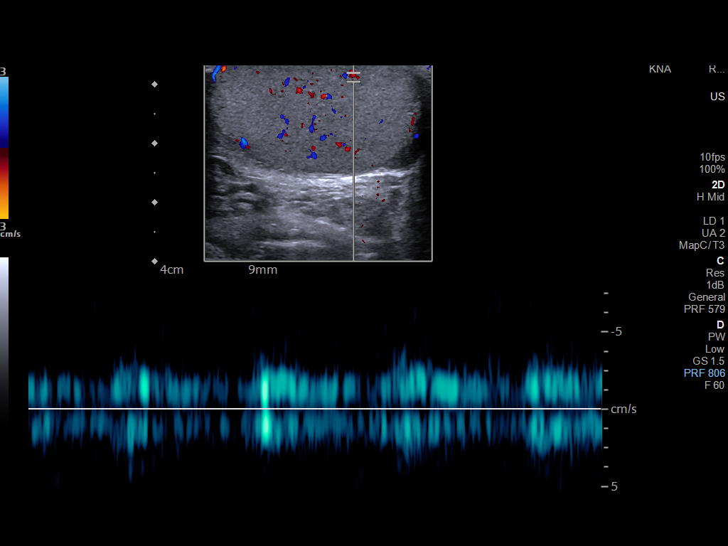
[im 51/68]
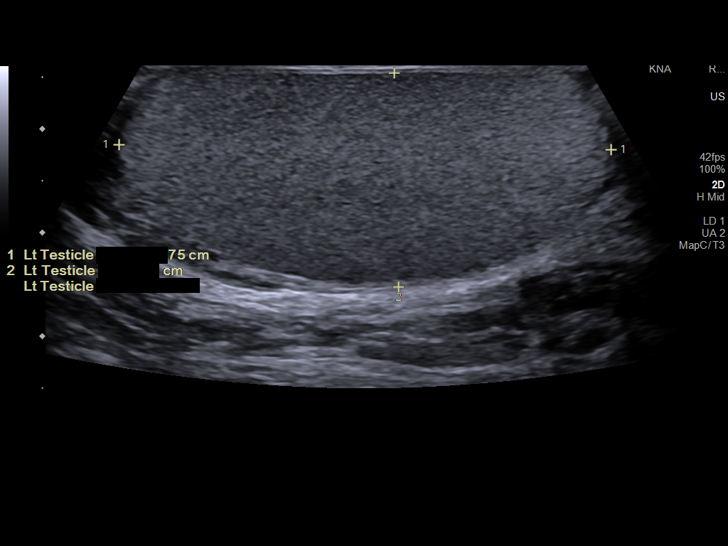
[im 56/68]
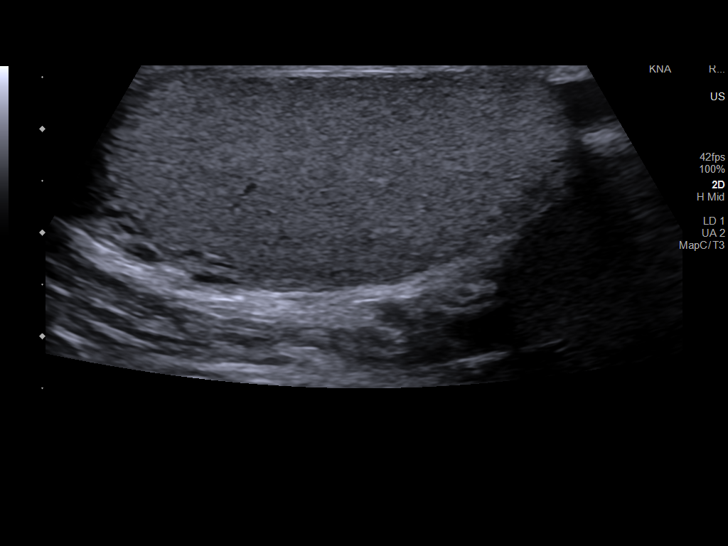
[im 62/68]
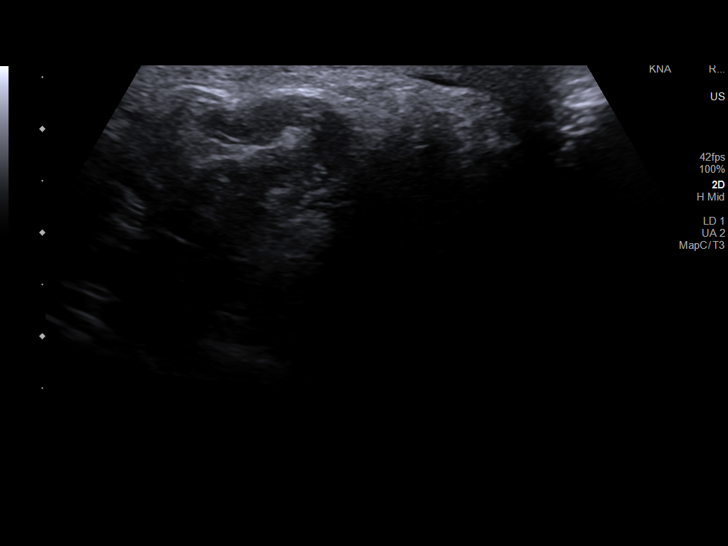
[im 68/68]
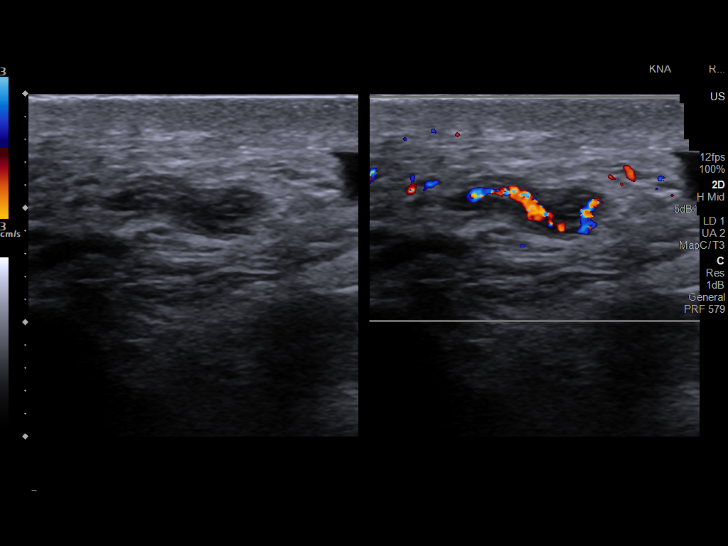

[14 of 25 positions shown; findings below may reference images not displayed]

FINDINGS: Right testicle

Measurements: 4.4 x 2.2 x 2.8 cm. No mass or microlithiasis
visualized.

Left testicle

Measurements: 4.8 x 2.1 x 3.4 cm. No mass or microlithiasis
visualized.

Right epididymis:  Normal in size and appearance.

Left epididymis:  Normal in size and appearance.

Hydrocele:  Trace bilateral hydrocele

Varicocele:  None visualized.

Pulsed Doppler interrogation of both testes demonstrates normal low
resistance arterial and venous waveforms bilaterally.
IMPRESSION: Trace bilateral hydroceles.  Otherwise negative scrotal ultrasound.

## 2023-08-22 ENCOUNTER — Ambulatory Visit: Admitting: Physician Assistant

## 2023-08-22 ENCOUNTER — Encounter: Payer: Self-pay | Admitting: Physician Assistant

## 2023-08-22 ENCOUNTER — Ambulatory Visit (HOSPITAL_COMMUNITY)
Admission: RE | Admit: 2023-08-22 | Discharge: 2023-08-22 | Disposition: A | Discharge status: Home / Self Care | Source: Ambulatory Visit | Attending: Physician Assistant | Admitting: Physician Assistant

## 2023-08-22 VITALS — BP 131/77 | HR 83 | Temp 98.4°F | Ht 72.0 in | Wt 210.0 lb

## 2023-08-22 DIAGNOSIS — M25561 Pain in right knee: Secondary | ICD-10-CM | POA: Insufficient documentation

## 2023-08-22 DIAGNOSIS — G8929 Other chronic pain: Secondary | ICD-10-CM

## 2023-08-22 DIAGNOSIS — M25512 Pain in left shoulder: Secondary | ICD-10-CM

## 2023-08-22 DIAGNOSIS — R5382 Chronic fatigue, unspecified: Secondary | ICD-10-CM | POA: Diagnosis not present

## 2023-08-22 DIAGNOSIS — Z7689 Persons encountering health services in other specified circumstances: Secondary | ICD-10-CM

## 2023-08-22 NOTE — Assessment & Plan Note (Signed)
 Patient presents today with 2-3 months of fatigue. No constitutional or associated symptoms. Relates history of depression. Requesting lab work today, specifically testosterone  levels. Will go ahead with CBC, CMP, and testosterone .

## 2023-08-22 NOTE — Assessment & Plan Note (Signed)
 Patient presents today with chronic right knee pain. No tenderness on exam, round protrusion on superior aspect of tibial, firm, no tender, and non mobile. No erythema, swelling, or decreased ROM. I considered but doubt septic arthritis or septic bursitis. Will go ahead with imaging of the right knee today. Activities as tolerated. NSAIDs, ice and heat. Follow up if symptoms worsen or experiencing fevers, redness, or new swelling.

## 2023-08-22 NOTE — Assessment & Plan Note (Signed)
 Patient presents today with chronic left shoulder pain following a clavicle injury. Tenderness throughout exam, specifically anterior tenderness, over the [facility] joint. Left shoulder x-rays today. Avoid aggravating activities/motions. Ice and heat. NSAIDs as needed. May use topical Voltaren gel. Discussed potential orthopedic referral based on imaging.

## 2023-08-22 NOTE — Progress Notes (Signed)
 New Patient Office Visit  Subjective    Patient ID: Jeremy Combs, male    DOB: 09-19-1980  Age: 42 y.o. MRN: [id number]  CC:  Chief Complaint  Patient presents with   New Patient (Initial Visit)    Here to establish care.     HPI Jeremy Combs presents to establish care  Patient presents today with no significant past medical history, does not take any prescription medications regularly. Has not been to a PCP in 4-5 years. Is requesting blood work today, reports increased fatigue. Additionally, he reports chronic left shoulder pain. Relates previous injury to his clavicle, stating he had fractured it but never got imaging or received care for the injury. States left shoulder pain with any activities that involve use of his deltoid, specifically pressing movements. Denies numbness and tingling down his arm or in his hands. Relates slightly decreased ROM in shoulder but can still do his regular activities. Lastly, relates right knee pain, specifically with kneeling or putting pressure on his knee. This has been ongoing for years as well. Patient participates in jujitsu and relates frequent kneeling. Denies decreased ROM, no pain with ambulation, no swelling, no redness. States occasional use of ibuprofen that does relieve symptoms.   Outpatient Encounter Medications as of 08/22/2023  Medication Sig   ibuprofen (ADVIL,MOTRIN) 200 MG tablet Take 600 mg by mouth every 6 (six) hours as needed for headache or moderate pain.    No facility-administered encounter medications on file as of 08/22/2023.    Past Medical History:  Diagnosis Date   Hypercholesterolemia     Past Surgical History:  Procedure Laterality Date   COLONOSCOPY WITH PROPOFOL  N/A 01/29/2018   Colonoscopy Dec 2019 with one 5 mm polyp in sigmoid colon s/p resection, non-bleeding internal hemorrhoids. Hyperplastic polyp. Next screening at age 28   NO PAST SURGERIES     POLYPECTOMY  01/29/2018   Procedure: POLYPECTOMY;   Surgeon: Shaaron Lamar HERO, MD;  Location: [facility];  Service: Endoscopy;;  (colon)    Family History  Problem Relation Age of Onset   Stroke Mother    Colon polyps Father 73   Diabetes Father    Throat cancer Father    Heart disease Father    Colon polyps Brother 56       Is going to need surgery to have them removed   Heart attack Paternal Grandmother    Sudden Cardiac Death Paternal Grandmother    Colon cancer Neg Hx     Social History   Socioeconomic History   Marital status: Married    Spouse name: Not on file   Number of children: Not on file   Years of education: Not on file   Highest education level: Not on file  Occupational History   Not on file  Tobacco Use   Smoking status: Never   Smokeless tobacco: Never  Vaping Use   Vaping status: Never Used  Substance and Sexual Activity   Alcohol use: Yes    Comment: beer-less than 6 pack/week   Drug use: Never   Sexual activity: Yes    Birth control/protection: None  Other Topics Concern   Not on file  Social History Narrative   Not on file   Social Drivers of Health   Financial Resource Strain: Not on file  Food Insecurity: Not on file  Transportation Needs: Not on file  Physical Activity: Not on file  Stress: Not on file  Social Connections: Unknown (06/14/2021)   Received  from [facility]   Social Network    Social Network: Not on file  Intimate Partner Violence: Unknown (05/17/2021)   Received from [facility]   HITS    Physically Hurt: Not on file    Insult or Talk Down To: Not on file    Threaten Physical Harm: Not on file    Scream or Curse: Not on file    Review of Systems  Constitutional:  Positive for malaise/fatigue. Negative for chills and fever.  Respiratory:  Negative for shortness of breath.   Cardiovascular:  Negative for chest pain and palpitations.  Musculoskeletal:  Positive for joint pain and neck pain. Negative for falls and myalgias.  Neurological:  Negative for tingling,  sensory change and headaches.        Objective    BP 131/77   Pulse 83   Temp 98.4 F (36.9 C)   Ht 6' (1.829 m)   Wt 210 lb (95.3 kg)   SpO2 98%   BMI 28.48 kg/m   Physical Exam Constitutional:      Appearance: Normal appearance.  HENT:     Head: Normocephalic.     Mouth/Throat:     Mouth: Mucous membranes are moist.     Pharynx: Oropharynx is clear.  Eyes:     Extraocular Movements: Extraocular movements intact.     Conjunctiva/sclera: Conjunctivae normal.  Cardiovascular:     Rate and Rhythm: Normal rate and regular rhythm.     Heart sounds: Normal heart sounds. No murmur heard. Pulmonary:     Effort: Pulmonary effort is normal.     Breath sounds: Normal breath sounds. No wheezing or rales.  Musculoskeletal:     Left shoulder: Tenderness and bony tenderness present. No swelling, deformity or laceration. Decreased range of motion. Normal strength.     Right knee: Deformity present. No swelling, effusion, erythema, bony tenderness or crepitus. Normal range of motion. No tenderness.     Comments: Protrusion inferior to patella, on superior aspect of tibia. Non tender. No erythema. Firm, non-mobile.   Skin:    General: Skin is warm and dry.  Neurological:     General: No focal deficit present.     Mental Status: He is alert and oriented to person, place, and time.  Psychiatric:        Mood and Affect: Mood normal.        Behavior: Behavior normal.       Assessment & Plan:  Encounter to establish care  Chronic left shoulder pain Assessment & Plan: Patient presents today with chronic left shoulder pain following a clavicle injury. Tenderness throughout exam, specifically anterior tenderness, over the [facility] joint. Left shoulder x-rays today. Avoid aggravating activities/motions. Ice and heat. NSAIDs as needed. May use topical Voltaren gel. Discussed potential orthopedic referral based on imaging.    Orders: -     DG Shoulder Left  Chronic pain of right  knee Assessment & Plan: Patient presents today with chronic right knee pain. No tenderness on exam, round protrusion on superior aspect of tibial, firm, no tender, and non mobile. No erythema, swelling, or decreased ROM. I considered but doubt septic arthritis or septic bursitis. Will go ahead with imaging of the right knee today. Activities as tolerated. NSAIDs, ice and heat. Follow up if symptoms worsen or experiencing fevers, redness, or new swelling.   Orders: -     DG Knee 1-2 Views Right  Chronic fatigue Assessment & Plan: Patient presents today with 2-3 months of fatigue.  No constitutional or associated symptoms. Relates history of depression. Requesting lab work today, specifically testosterone  levels. Will go ahead with CBC, CMP, and testosterone .   Orders: -     CBC with Differential/Platelet -     Comprehensive metabolic panel with GFR -     Testosterone     No follow-ups on file.   Charmaine Isadore Bokhari, PA-C

## 2023-08-25 ENCOUNTER — Ambulatory Visit: Payer: Self-pay | Admitting: Physician Assistant

## 2023-08-26 LAB — COMPREHENSIVE METABOLIC PANEL WITH GFR
ALT: 26 IU/L (ref 0–44)
AST: 22 IU/L (ref 0–40)
Albumin: 4.3 g/dL (ref 4.1–5.1)
Alkaline Phosphatase: 61 IU/L (ref 44–121)
BUN/Creatinine Ratio: 13 (ref 9–20)
BUN: 16 mg/dL (ref 6–24)
Bilirubin Total: 0.2 mg/dL (ref 0.0–1.2)
CO2: 23 mmol/L (ref 20–29)
Calcium: 9.6 mg/dL (ref 8.7–10.2)
Chloride: 102 mmol/L (ref 96–106)
Creatinine, Ser: 1.26 mg/dL (ref 0.76–1.27)
Globulin, Total: 2.7 g/dL (ref 1.5–4.5)
Glucose: 94 mg/dL (ref 70–99)
Potassium: 4.5 mmol/L (ref 3.5–5.2)
Sodium: 140 mmol/L (ref 134–144)
Total Protein: 7 g/dL (ref 6.0–8.5)
eGFR: 73 mL/min/1.73 (ref >59)

## 2023-08-26 LAB — CBC WITH DIFFERENTIAL/PLATELET
Basophils Absolute: 0.1 x10E3/uL (ref 0.0–0.2)
Basos: 1 %
EOS (ABSOLUTE): 0.2 x10E3/uL (ref 0.0–0.4)
Eos: 3 %
Hematocrit: 48.7 % (ref 37.5–51.0)
Hemoglobin: 16.2 g/dL (ref 13.0–17.7)
Immature Grans (Abs): 0 x10E3/uL (ref 0.0–0.1)
Immature Granulocytes: 0 %
Lymphocytes Absolute: 2.5 x10E3/uL (ref 0.7–3.1)
Lymphs: 33 %
MCH: 30.5 pg (ref 26.6–33.0)
MCHC: 33.3 g/dL (ref 31.5–35.7)
MCV: 92 fL (ref 79–97)
Monocytes Absolute: 0.6 x10E3/uL (ref 0.1–0.9)
Monocytes: 8 %
Neutrophils Absolute: 4.2 x10E3/uL (ref 1.4–7.0)
Neutrophils: 54 %
Platelets: 225 x10E3/uL (ref 150–450)
RBC: 5.31 x10E6/uL (ref 4.14–5.80)
RDW: 13.2 % (ref 11.6–15.4)
WBC: 7.6 x10E3/uL (ref 3.4–10.8)

## 2023-08-26 LAB — TESTOSTERONE: Testosterone: 634 ng/dL (ref 264–916)

## 2023-10-16 ENCOUNTER — Encounter: Payer: Self-pay | Admitting: Physician Assistant

## 2023-10-21 ENCOUNTER — Other Ambulatory Visit: Payer: Self-pay | Admitting: Physician Assistant

## 2023-10-21 DIAGNOSIS — L989 Disorder of the skin and subcutaneous tissue, unspecified: Secondary | ICD-10-CM

## 2023-10-28 IMAGING — DX DG CHEST 1V PORT
1 series · 1 of 1 positions shown · non-contrast
Comparison: None.

CLINICAL DATA: Chest pain

EXAM:
PORTABLE CHEST 1 VIEW

[chest ap]
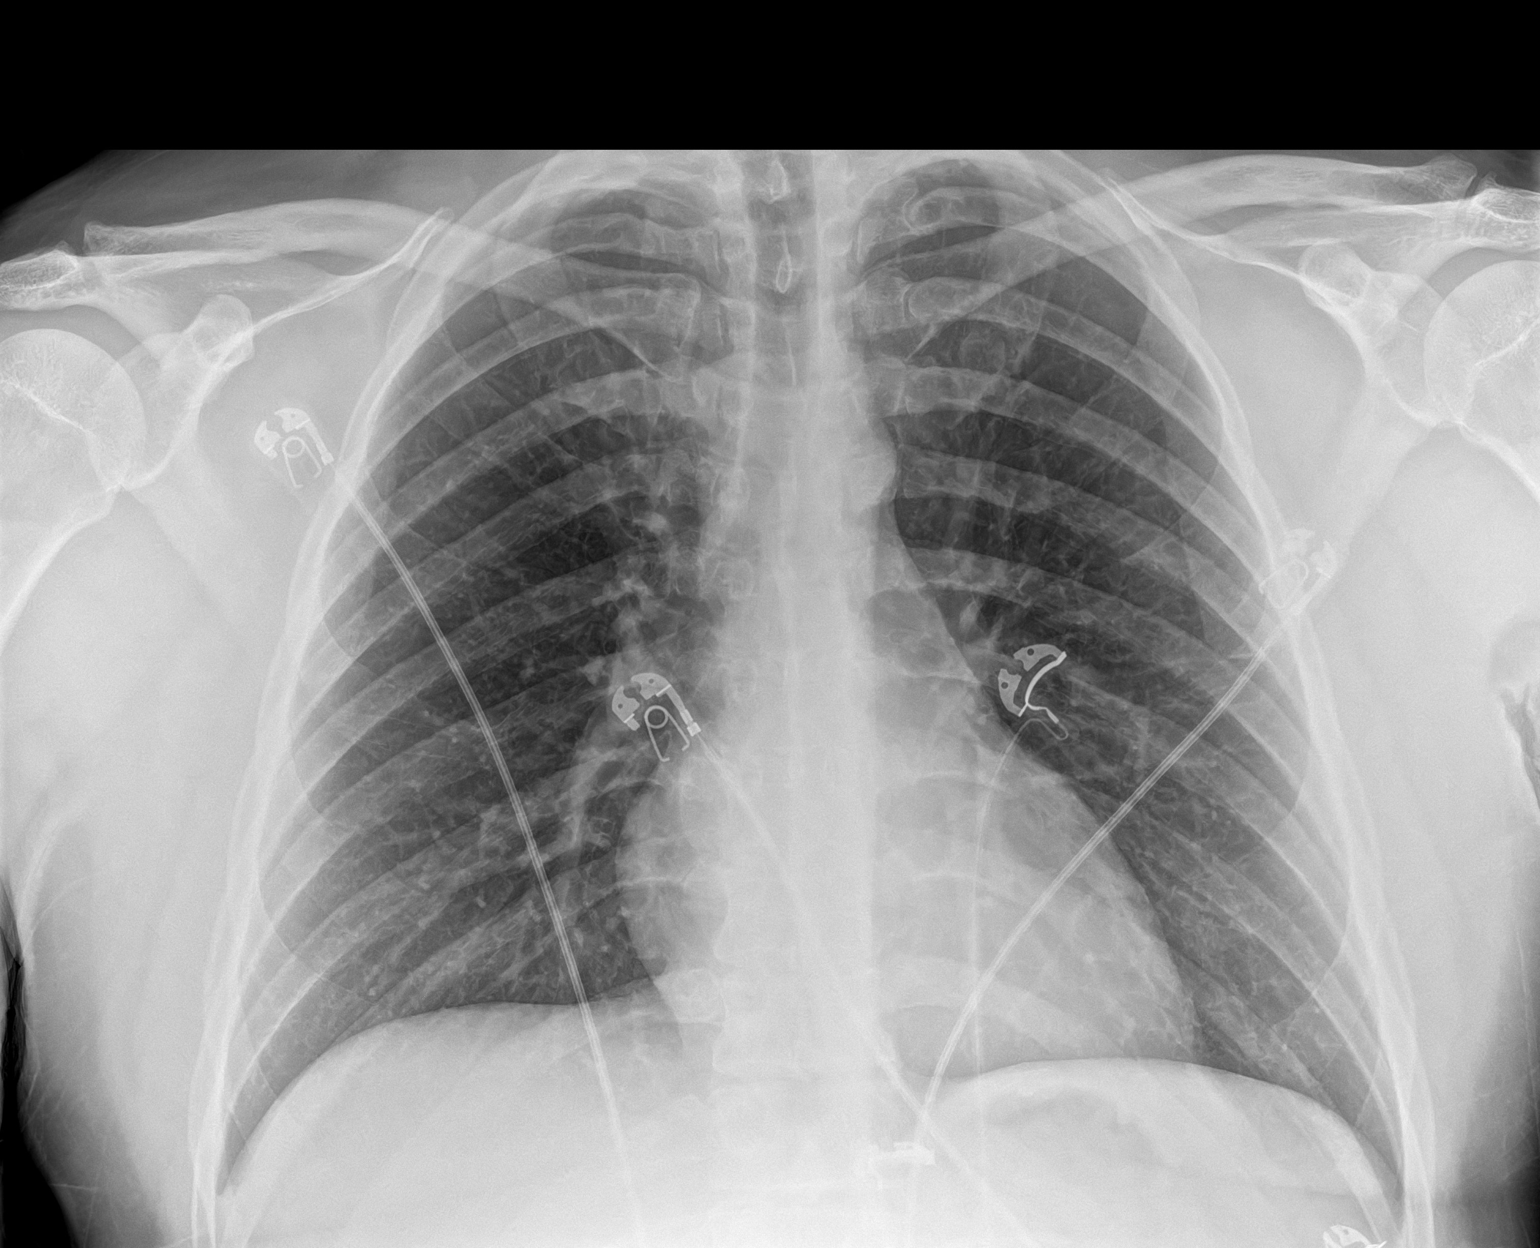

[1 of 1 positions shown; findings below may reference images not displayed]

FINDINGS: The heart size and mediastinal contours are within normal limits.
Both lungs are clear. The visualized skeletal structures are
unremarkable.
IMPRESSION: Normal study.

## 2023-11-24 ENCOUNTER — Encounter: Payer: Self-pay | Admitting: Physician Assistant

## 2023-11-25 NOTE — Telephone Encounter (Signed)
 Messaged referral staff about this request

## 2023-12-01 NOTE — Telephone Encounter (Signed)
 Per referral coordinator: Appointment scheduled and patient is aware

## 2024-06-29 ENCOUNTER — Ambulatory Visit: Admitting: Physician Assistant
